# Patient Record
Sex: Female | Born: 1970 | Race: White | Hispanic: No | Marital: Married | Smoking: Former smoker
Health system: Southern US, Community
[De-identification: ages and names within clinical notes are randomized; demographics above are authoritative.]

## PROBLEM LIST (undated history)

## (undated) DIAGNOSIS — M069 Rheumatoid arthritis, unspecified: Secondary | ICD-10-CM

## (undated) DIAGNOSIS — R9389 Abnormal findings on diagnostic imaging of other specified body structures: Secondary | ICD-10-CM

## (undated) HISTORY — PX: LEEP: SHX91

## (undated) HISTORY — PX: BREAST BIOPSY: SHX20

---

## 2009-08-09 ENCOUNTER — Encounter: Admission: RE | Admit: 2009-08-09 | Discharge: 2009-08-09 | Payer: Self-pay | Admitting: Obstetrics and Gynecology

## 2015-05-21 ENCOUNTER — Other Ambulatory Visit: Payer: Self-pay | Admitting: Obstetrics and Gynecology

## 2015-05-21 DIAGNOSIS — R928 Other abnormal and inconclusive findings on diagnostic imaging of breast: Secondary | ICD-10-CM

## 2015-05-25 ENCOUNTER — Ambulatory Visit
Admission: RE | Admit: 2015-05-25 | Discharge: 2015-05-25 | Disposition: A | Discharge status: Home / Self Care | Payer: Commercial Indemnity | Source: Ambulatory Visit | Attending: Obstetrics and Gynecology | Admitting: Obstetrics and Gynecology

## 2015-05-25 DIAGNOSIS — R928 Other abnormal and inconclusive findings on diagnostic imaging of breast: Secondary | ICD-10-CM

## 2016-10-26 ENCOUNTER — Emergency Department (HOSPITAL_COMMUNITY)
Admission: EM | Admit: 2016-10-26 | Discharge: 2016-10-26 | Disposition: A | Discharge status: Home / Self Care | Payer: Managed Care, Other (non HMO) | Attending: Emergency Medicine | Admitting: Emergency Medicine

## 2016-10-26 ENCOUNTER — Encounter (HOSPITAL_COMMUNITY): Payer: Self-pay | Admitting: Emergency Medicine

## 2016-10-26 DIAGNOSIS — H9202 Otalgia, left ear: Secondary | ICD-10-CM | POA: Diagnosis present

## 2016-10-26 DIAGNOSIS — H6012 Cellulitis of left external ear: Secondary | ICD-10-CM | POA: Diagnosis not present

## 2016-10-26 DIAGNOSIS — H60391 Other infective otitis externa, right ear: Secondary | ICD-10-CM

## 2016-10-26 HISTORY — DX: Rheumatoid arthritis, unspecified: M06.9

## 2016-10-26 MED ORDER — DIPHENHYDRAMINE HCL 50 MG/ML IJ SOLN
25.0000 mg | Freq: Once | INTRAMUSCULAR | Status: AC
  Administered 2016-10-26: 25 mg via INTRAVENOUS
  Filled 2016-10-26: qty 1

## 2016-10-26 MED ORDER — VANCOMYCIN HCL IN DEXTROSE 1-5 GM/200ML-% IV SOLN
1000.0000 mg | Freq: Once | INTRAVENOUS | Status: AC
  Administered 2016-10-26: 1000 mg via INTRAVENOUS
  Filled 2016-10-26: qty 200

## 2016-10-26 MED ORDER — CIPROFLOXACIN HCL 500 MG PO TABS: 500.0000 mg | ORAL_TABLET | Freq: Two times a day (BID) | ORAL | 0 refills | Status: DC

## 2016-10-26 MED ORDER — SODIUM CHLORIDE 0.9 % IV BOLUS (SEPSIS)
1000.0000 mL | Freq: Once | INTRAVENOUS | Status: AC
  Administered 2016-10-26: 1000 mL via INTRAVENOUS

## 2016-10-26 MED ORDER — IBUPROFEN 400 MG PO TABS
400.0000 mg | ORAL_TABLET | Freq: Once | ORAL | Status: AC
  Administered 2016-10-26: 400 mg via ORAL
  Filled 2016-10-26: qty 1

## 2016-10-26 MED ORDER — ACETAMINOPHEN 325 MG PO TABS
ORAL_TABLET | ORAL | Status: AC
  Administered 2016-10-26: 650 mg via ORAL
  Filled 2016-10-26: qty 2

## 2016-10-26 MED ORDER — ACETAMINOPHEN 325 MG PO TABS
650.0000 mg | ORAL_TABLET | Freq: Once | ORAL | Status: AC
  Administered 2016-10-26: 650 mg via ORAL

## 2016-10-26 MED ORDER — ONDANSETRON HCL 4 MG/2ML IJ SOLN
4.0000 mg | Freq: Once | INTRAMUSCULAR | Status: AC
  Administered 2016-10-26: 4 mg via INTRAVENOUS
  Filled 2016-10-26: qty 2

## 2016-10-26 MED ORDER — CEPHALEXIN 500 MG PO CAPS
1000.0000 mg | ORAL_CAPSULE | Freq: Two times a day (BID) | ORAL | 0 refills | Status: DC
Start: 2016-10-26 — End: 2023-02-19

## 2016-10-26 MED ORDER — MORPHINE SULFATE (PF) 4 MG/ML IV SOLN
4.0000 mg | Freq: Once | INTRAVENOUS | Status: AC
  Administered 2016-10-26: 4 mg via INTRAVENOUS
  Filled 2016-10-26: qty 1

## 2016-10-26 MED ORDER — CIPROFLOXACIN IN D5W 400 MG/200ML IV SOLN
400.0000 mg | Freq: Once | INTRAVENOUS | Status: AC
  Administered 2016-10-26: 400 mg via INTRAVENOUS
  Filled 2016-10-26: qty 200

## 2016-10-26 NOTE — Discharge Instructions (Addendum)
It was our pleasure to provide your ER care today - we hope that you feel better.  Take antibiotics (keflex and cipro) as prescribed.  Take tylenol every 4 hours and motrin every 6 hours as need for pain and/or fever.   Follow up with primary care doctor, or here, for recheck tomorrow.   Return to ER if worse, new symptoms, spreading redness, intractable pain, weak/fainting, other concern.

## 2016-10-26 NOTE — ED Provider Notes (Signed)
[facility]-EMERGENCY DEPT Provider Note   CSN: [id number] Arrival date & time: 10/26/16  1116  By signing my name below, I, Linna Darner, attest that this documentation has been prepared under the direction and in the presence of physician practitioner, Cathren Laine, MD. Electronically Signed: Linna Darner, Scribe. 10/26/2016. 12:26 PM.  History   Chief Complaint Chief Complaint  Patient presents with  . Otalgia    The history is provided by the patient. No language interpreter was used.     HPI Comments: Autumn Russo is a 46 y.o. female who presents to the Emergency Department complaining of constant left ear pain beginning yesterday. She reports associated cervical lymphadenopathy, body aches, mild fever (tmax 101.5), a mild non-productive cough, nausea, and rhinorrhea. Pt was evaluated at a [facility] this morning and was diagnosed with a left ear infection; she was additionally swabbed for influenza and the test was negative. No recent trauma to her left ear. No h/o frequent ear infections. She notes her right ear feels normal. NKDA. Pt denies trouble swallowing, sore throat, vomiting, diarrhea, hearing loss, tinnitus, or any other associated symptoms.  Past Medical History:  Diagnosis Date  . RA (rheumatoid arthritis) (HCC)     There are no active problems to display for this patient.   History reviewed. No pertinent surgical history.  OB History    No data available       Home Medications    Prior to Admission medications   Not on File    Family History History reviewed. No pertinent family history.  Social History Social History  Substance Use Topics  . Smoking status: Never Smoker  . Smokeless tobacco: Never Used  . Alcohol use No     Allergies   Patient has no known allergies.   Review of Systems Review of Systems  Constitutional: Positive for fever.  HENT: Positive for ear pain (L) and rhinorrhea. Negative for hearing loss, sore throat, tinnitus  and trouble swallowing.   Respiratory: Positive for cough.   Gastrointestinal: Positive for nausea. Negative for diarrhea and vomiting.  Musculoskeletal: Positive for myalgias.  Hematological: Positive for adenopathy.  All other systems reviewed and are negative.    Physical Exam Updated Vital Signs BP 133/91 (BP Location: Right Arm)   Pulse (!) 122   Temp 100 F (37.8 C) (Oral)   Resp 18   SpO2 99%   Physical Exam  Constitutional: She is oriented to person, place, and time. She appears well-developed and well-nourished. No distress.  HENT:  Head: Normocephalic and atraumatic.  Left Ear: There is swelling. Tympanic membrane is erythematous.  Nose: Nose normal.  Mouth/Throat: Oropharynx is clear and moist.  Left otitis externa, w cellulitis left ear. Left preauricular and cervical l/a. No discrete mass felt.  Tm mildly erythematous. No focal mastoid tenderness.   Eyes: Conjunctivae and EOM are normal.  Neck: Neck supple. No tracheal deviation present.  No stiffness or rigidity.   Cardiovascular: Regular rhythm and normal heart sounds.   Pulmonary/Chest: Effort normal and breath sounds normal. No respiratory distress.  Musculoskeletal: Normal range of motion. She exhibits no edema.  Lymphadenopathy:       Head (left side): Preauricular adenopathy present.    She has cervical adenopathy (left anterior).  Neurological: She is alert and oriented to person, place, and time.  Skin: Skin is warm and dry. No rash noted.  Psychiatric: She has a normal mood and affect. Her behavior is normal.  Nursing note and vitals reviewed.  ED Treatments / Results  Labs (all labs ordered are listed, but only abnormal results are displayed) Labs Reviewed - No data to display  EKG  EKG Interpretation None       Radiology No results found.  Procedures Procedures (including critical care time)  DIAGNOSTIC STUDIES: Oxygen Saturation is 99% on RA, normal by my interpretation.     COORDINATION OF CARE: 12:33 PM Discussed treatment plan with pt at bedside and pt agreed to plan.  Medications Ordered in ED Medications  acetaminophen (TYLENOL) tablet 650 mg (650 mg Oral Given 10/26/16 1144)     Initial Impression / Assessment and Plan / ED Course  I have reviewed the triage vital signs and the nursing notes.  Pertinent labs & imaging results that were available during my care of the patient were reviewed by me and considered in my medical decision making (see chart for details).  Exam c/w otitis externa, with left preauricular and cervical l/a.    Confirmed nkda w pt.  cipro iv. vanc iv.   Iv ns bolus.   Morphine iv for pain (pt has ride).  Motrin po.  Recheck comfortable appearing.    rx for home.  Return precautions provided.  ENT f/u in next couple days if symptoms fail to improve.    Final Clinical Impressions(s) / ED Diagnoses   Final diagnoses:  None    New Prescriptions New Prescriptions   No medications on file  I personally performed the services described in this documentation, which was scribed in my presence. The recorded information has been reviewed and considered. Cathren LaineKevin Louisiana Searles, MD    Cathren LaineKevin Danya Spearman, MD 10/26/16 [id number]

## 2016-10-26 NOTE — ED Triage Notes (Signed)
Pt here for left ear pain and swelling with swelling into glands in neck; [facility]; fever noted

## 2020-10-23 ENCOUNTER — Other Ambulatory Visit: Payer: Self-pay | Admitting: Obstetrics and Gynecology

## 2020-10-23 DIAGNOSIS — R928 Other abnormal and inconclusive findings on diagnostic imaging of breast: Secondary | ICD-10-CM

## 2020-11-06 ENCOUNTER — Other Ambulatory Visit: Payer: Self-pay

## 2020-11-06 ENCOUNTER — Ambulatory Visit
Admission: RE | Admit: 2020-11-06 | Discharge: 2020-11-06 | Disposition: A | Discharge status: Home / Self Care | Payer: Commercial Indemnity | Source: Ambulatory Visit | Attending: Obstetrics and Gynecology | Admitting: Obstetrics and Gynecology

## 2020-11-06 ENCOUNTER — Other Ambulatory Visit: Payer: Self-pay | Admitting: Obstetrics and Gynecology

## 2020-11-06 DIAGNOSIS — R928 Other abnormal and inconclusive findings on diagnostic imaging of breast: Secondary | ICD-10-CM

## 2020-11-09 ENCOUNTER — Other Ambulatory Visit: Payer: Self-pay | Admitting: Obstetrics and Gynecology

## 2020-11-09 ENCOUNTER — Ambulatory Visit
Admission: RE | Admit: 2020-11-09 | Discharge: 2020-11-09 | Disposition: A | Discharge status: Home / Self Care | Payer: Managed Care, Other (non HMO) | Source: Ambulatory Visit | Attending: Obstetrics and Gynecology | Admitting: Obstetrics and Gynecology

## 2020-11-09 ENCOUNTER — Other Ambulatory Visit: Payer: Self-pay

## 2020-11-09 DIAGNOSIS — R928 Other abnormal and inconclusive findings on diagnostic imaging of breast: Secondary | ICD-10-CM

## 2020-11-14 ENCOUNTER — Other Ambulatory Visit: Payer: Managed Care, Other (non HMO)

## 2022-05-08 IMAGING — MG MM DIGITAL DIAGNOSTIC UNILAT*L* W/ TOMO W/ CAD
4 series · 4 of 12 positions shown · non-contrast
Comparison: Previous exams including recent screening mammogram
dated 10/17/2020

CLINICAL DATA: Patient returns today to evaluate a possible LEFT
breast asymmetry questioned on recent screening mammogram

EXAM:
DIGITAL DIAGNOSTIC UNILATERAL LEFT MAMMOGRAM WITH TOMOSYNTHESIS AND
CAD; ULTRASOUND LEFT BREAST LIMITED
TECHNIQUE: Left digital diagnostic mammography and breast tomosynthesis was
performed. The images were evaluated with computer-aided detection.;
Targeted ultrasound examination of the left breast was performed

[L ML synth-2D]
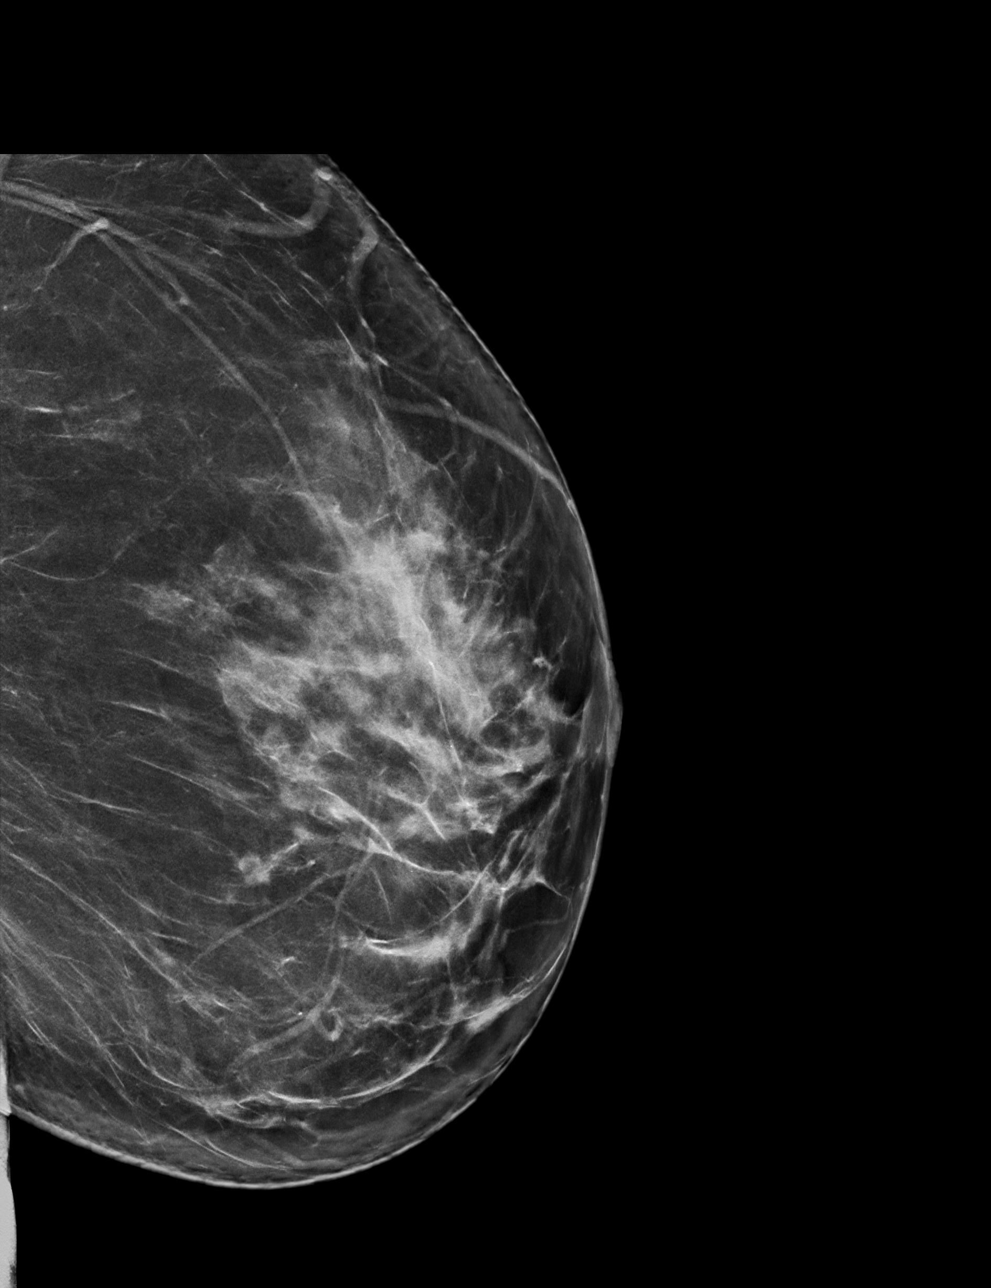

[L CC synth-2D]
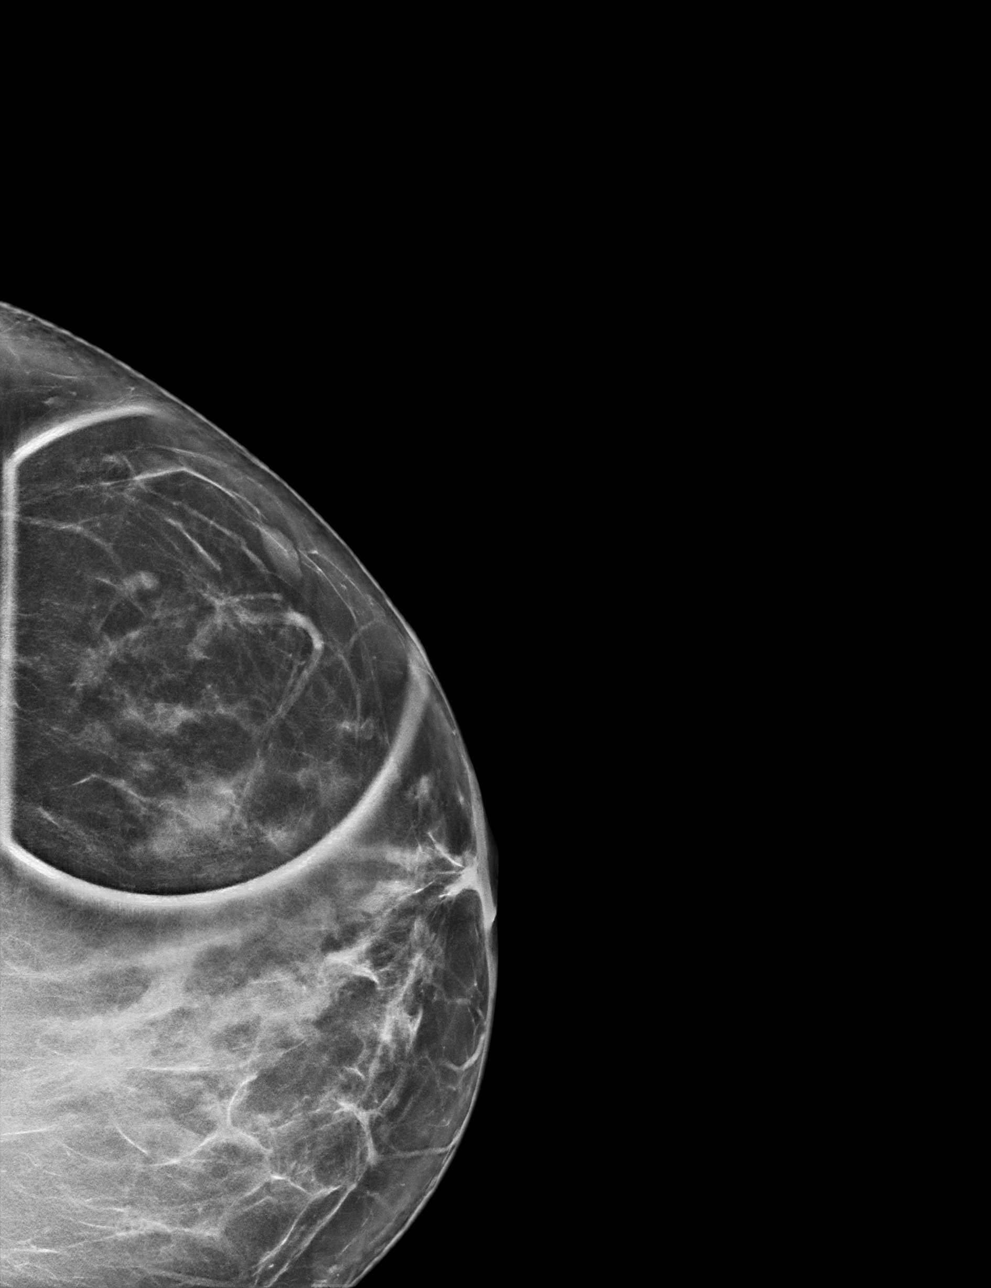

[L ML tomo · tomo slice 41/80.0]
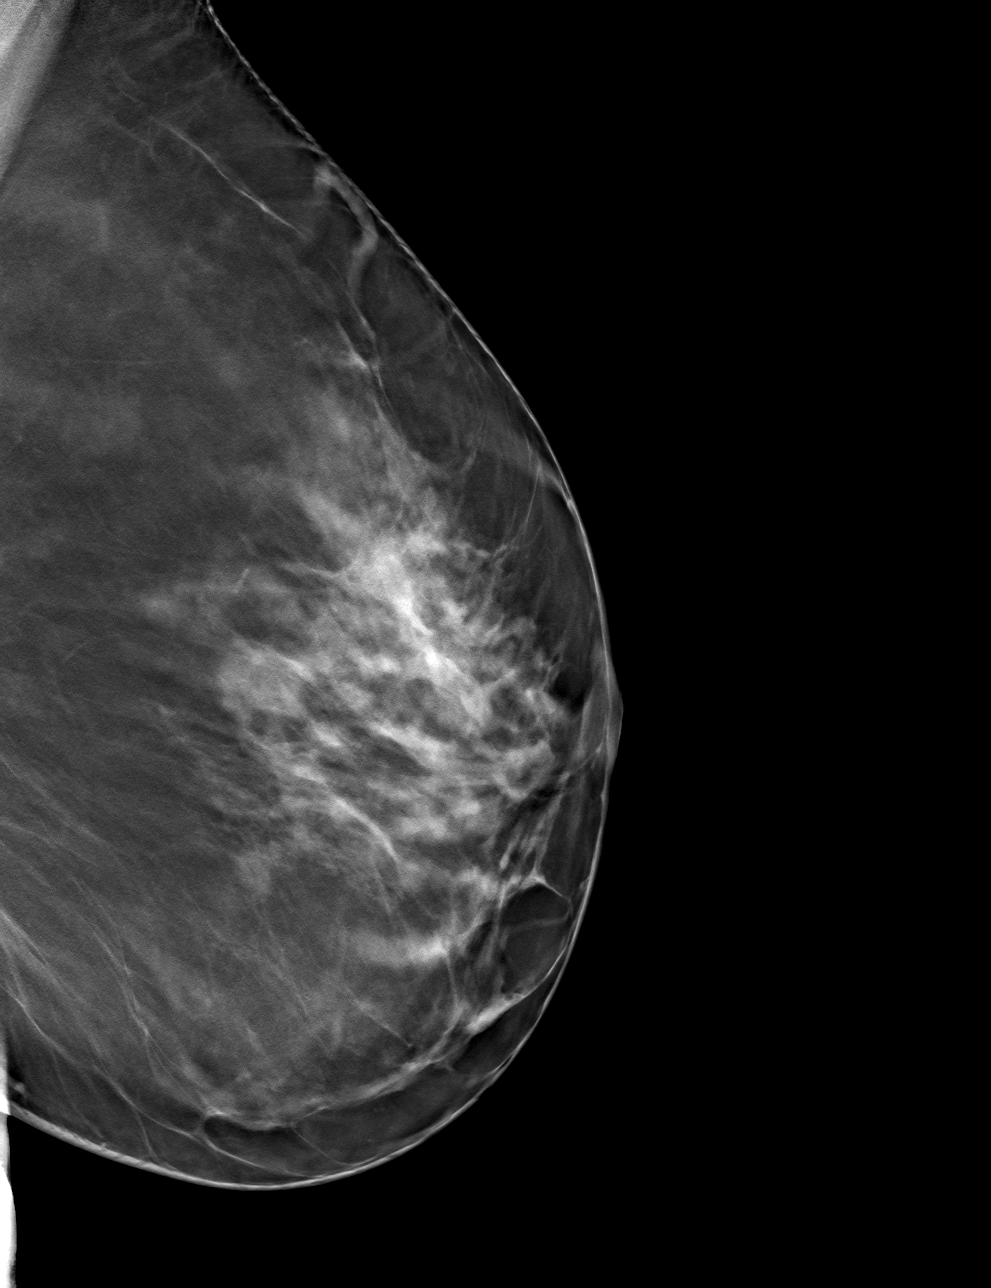

[L CC tomo · tomo slice 35/69.0]
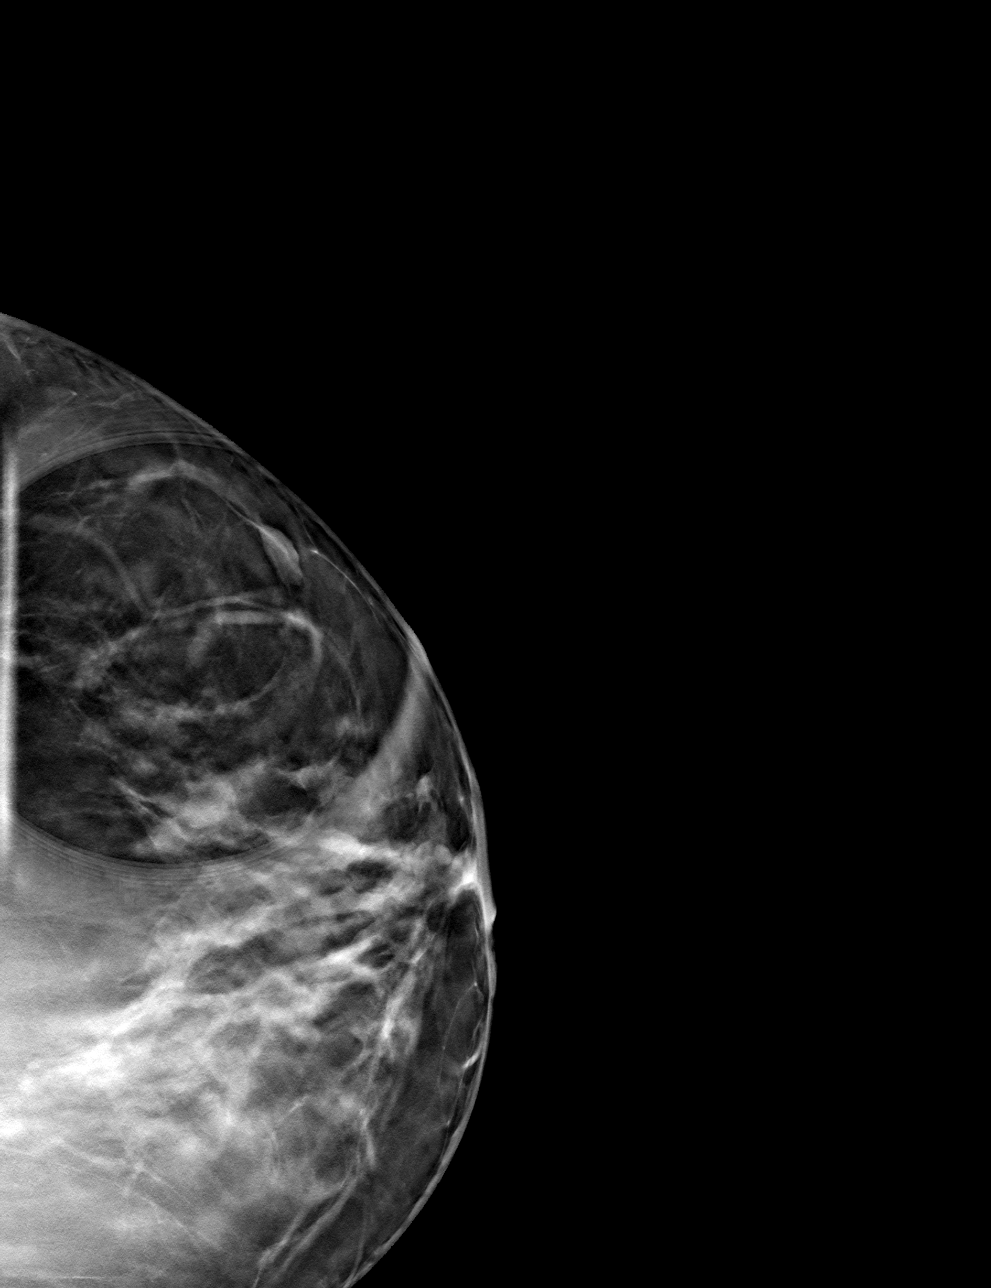

[4 of 12 positions shown; findings below may reference images not displayed]

ACR Breast Density Category c: The breast tissue is heterogeneously
dense, which may obscure small masses.
FINDINGS: LEFT breast diagnostic mammogram: On today's additional diagnostic
views, including spot compression with 3D tomosynthesis, an oval
circumscribed mass is confirmed within the outer LEFT breast, 3-4
o'clock axis based on tomosynthesis slice position, measuring
approximately 1 x 0.4 cm.

Targeted ultrasound is performed, showing an oval circumscribed
hypoechoic mass in the LEFT breast at the 3 o'clock axis, 6 cm from
the nipple, with internal vascularity, measuring 9 x 3 x 7 mm,
corresponding to the mammographic finding.

LEFT axilla was evaluated with ultrasound showing no enlarged or
morphologically abnormal lymph nodes.
IMPRESSION: Oval circumscribed hypoechoic mass in the LEFT breast at the 3
o'clock axis, 6 cm from the nipple, measuring 9 mm, with internal
vascularity, corresponding to the mammographic finding. This is a
suspicious finding for which ultrasound-guided biopsy is
recommended.

RECOMMENDATION:
Ultrasound-guided biopsy for the LEFT breast mass at the 3 o'clock
axis, 6 cm from the nipple, measuring 9 mm.

Ultrasound-guided biopsy is scheduled for [REDACTED].

I have discussed the findings and recommendations with the patient.
If applicable, a reminder letter will be sent to the patient
regarding the next appointment.

BI-RADS CATEGORY  4: Suspicious.

## 2022-05-08 IMAGING — US US BREAST*L* LIMITED INC AXILLA
1 series · 13 of 14 positions shown · non-contrast
Comparison: Previous exams including recent screening mammogram
dated 10/17/2020

CLINICAL DATA: Patient returns today to evaluate a possible LEFT
breast asymmetry questioned on recent screening mammogram

EXAM:
DIGITAL DIAGNOSTIC UNILATERAL LEFT MAMMOGRAM WITH TOMOSYNTHESIS AND
CAD; ULTRASOUND LEFT BREAST LIMITED
TECHNIQUE: Left digital diagnostic mammography and breast tomosynthesis was
performed. The images were evaluated with computer-aided detection.;
Targeted ultrasound examination of the left breast was performed

[Series 1: us breast*left* limited inc axilla · 0.06mm/px · 13 of 14 slices shown]
[im 1/14]
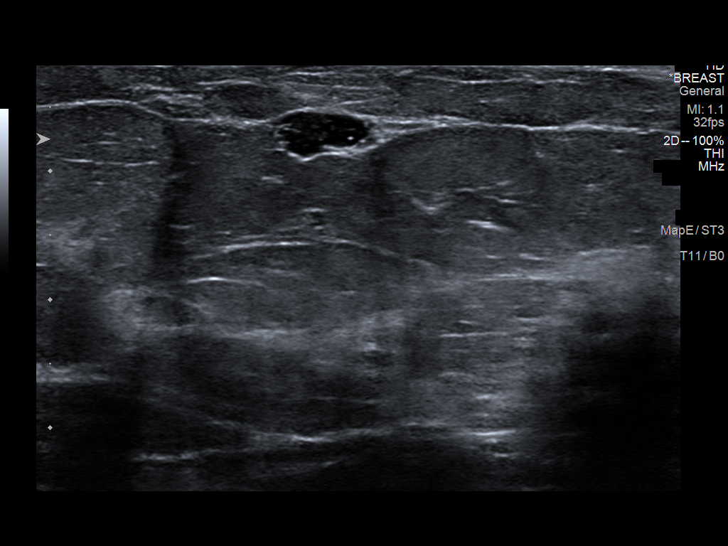
[im 2/14]
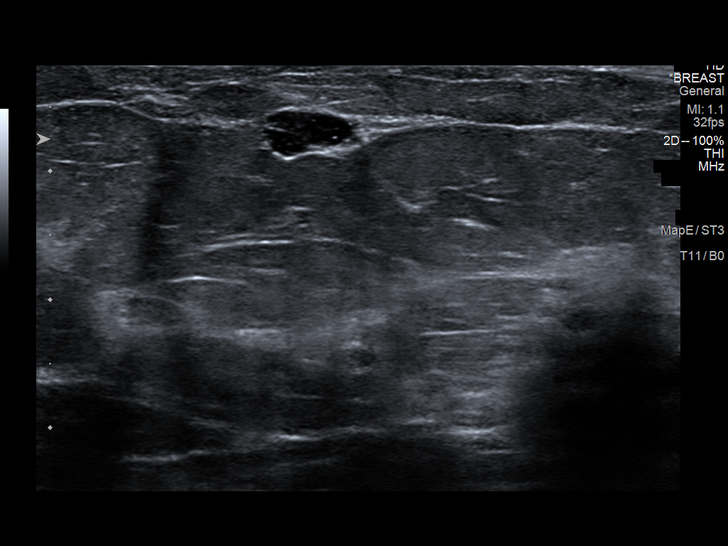
[im 3/14]
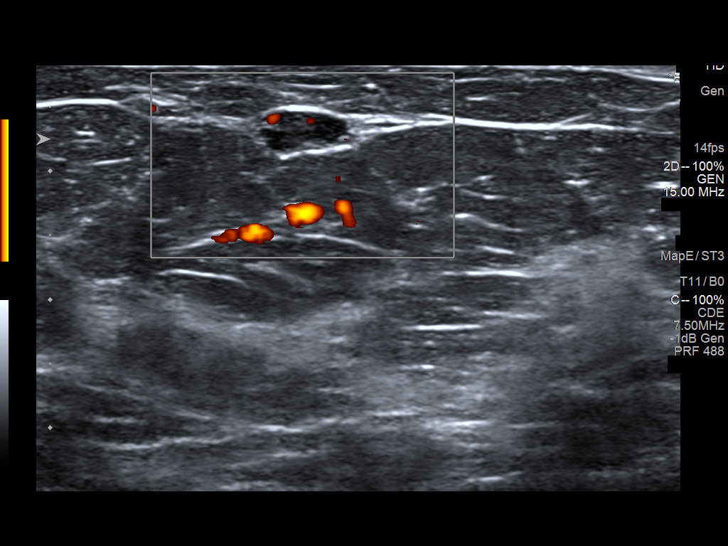
[im 4/14]
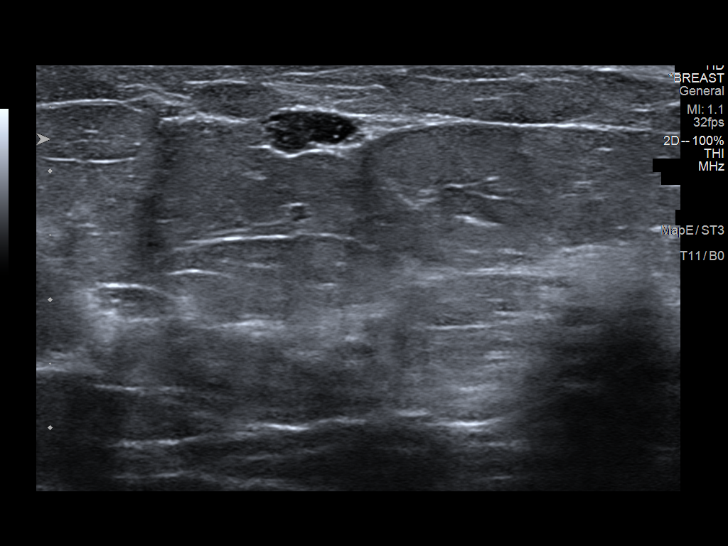
[im 5/14]
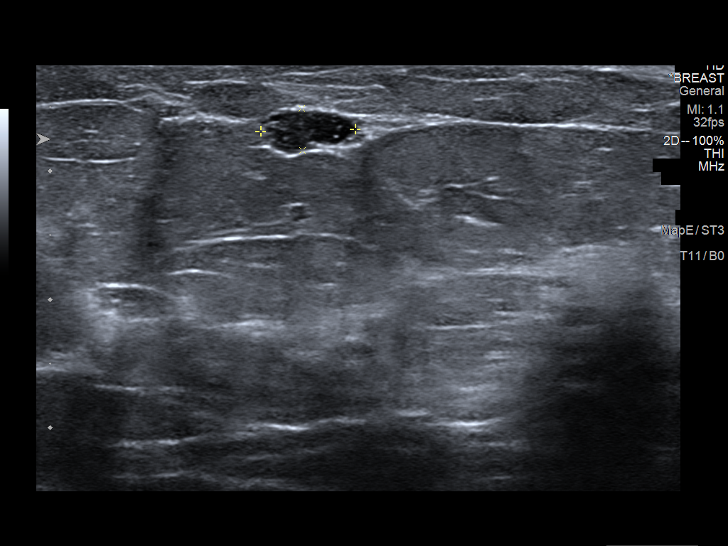
[im 6/14]
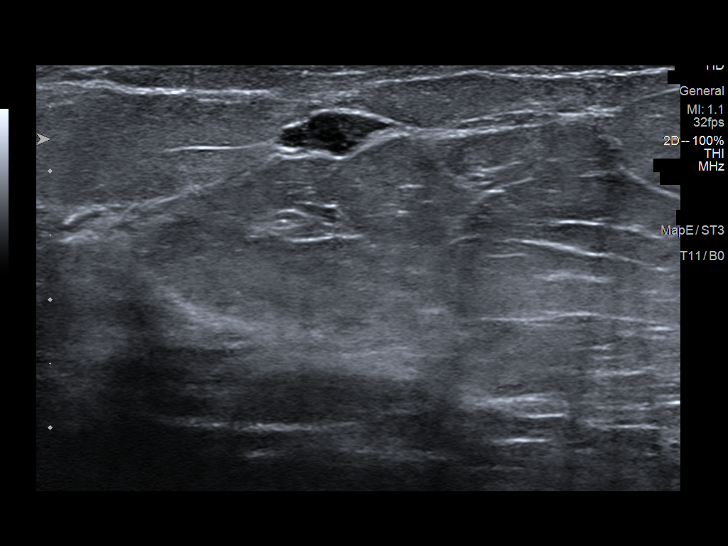
[im 8/14]
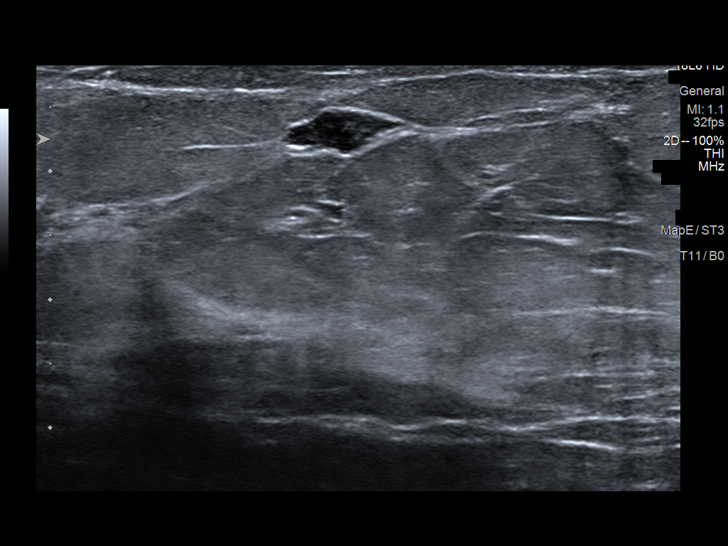
[im 9/14]
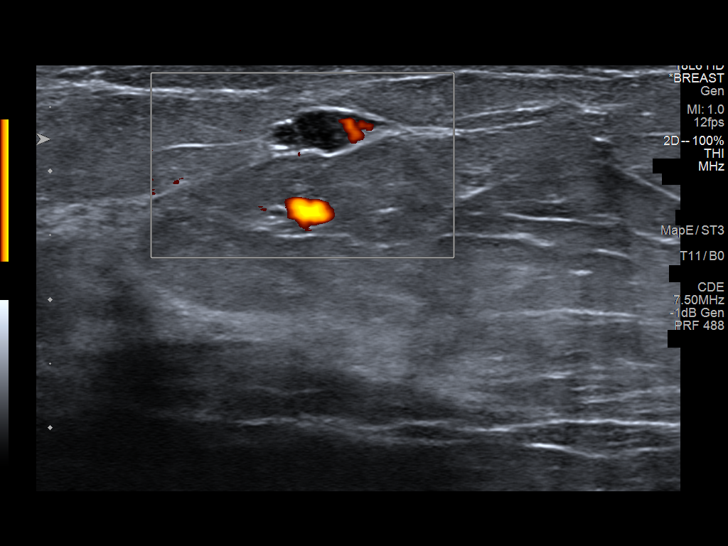
[im 10/14]
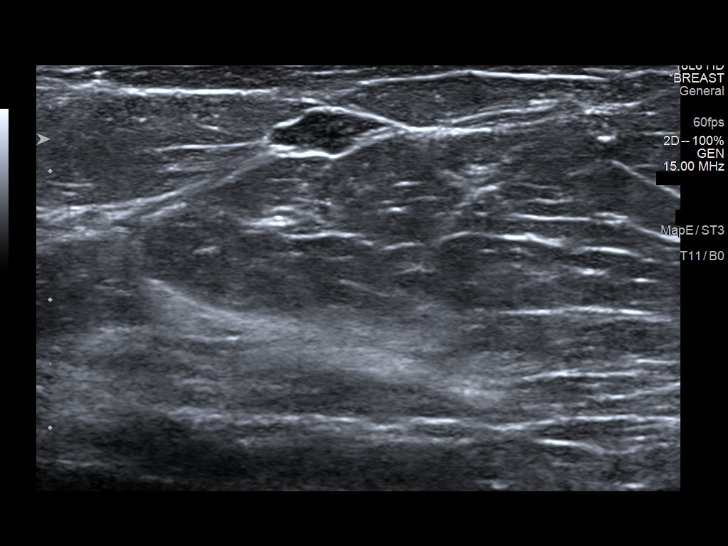
[im 11/14]
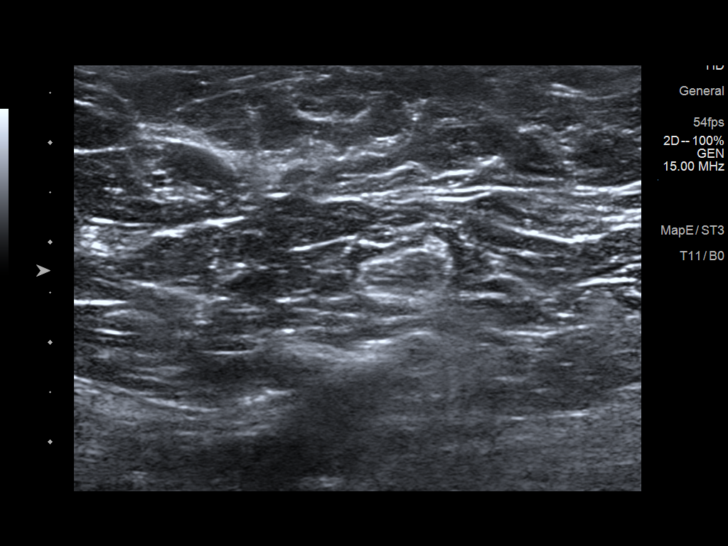
[im 12/14]
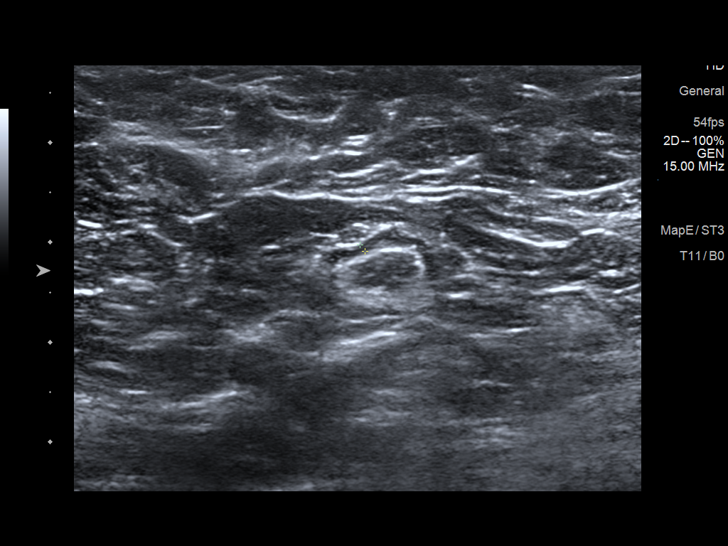
[im 13/14]
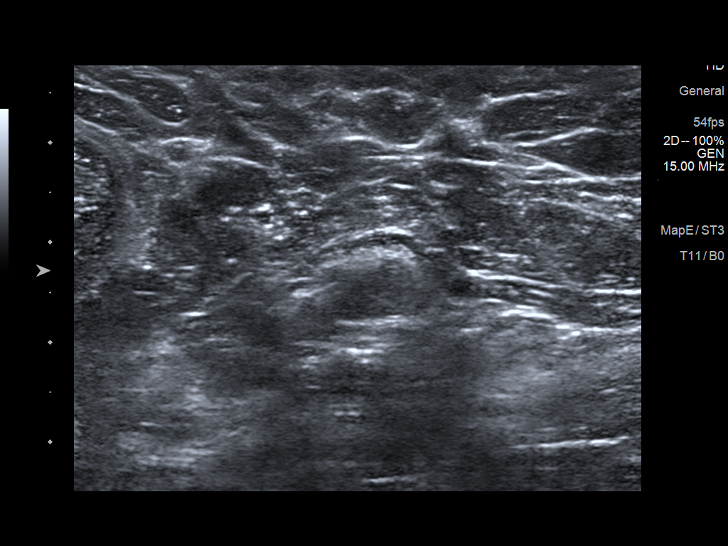
[im 14/14]
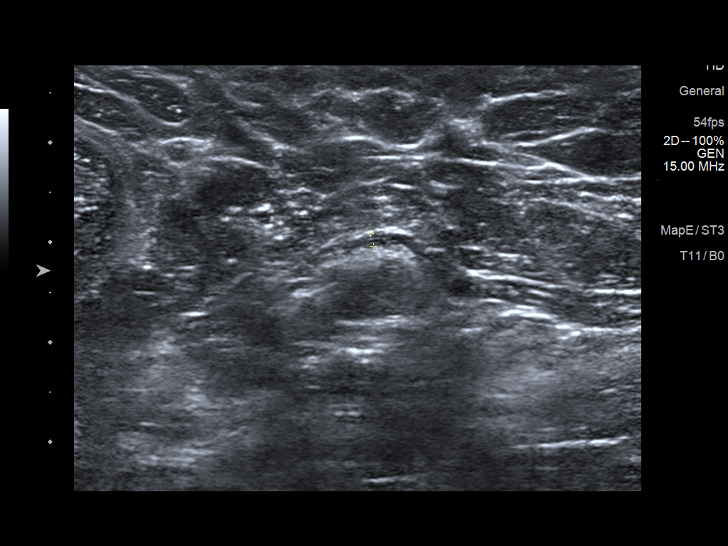

[13 of 14 positions shown; findings below may reference images not displayed]

ACR Breast Density Category c: The breast tissue is heterogeneously
dense, which may obscure small masses.
FINDINGS: LEFT breast diagnostic mammogram: On today's additional diagnostic
views, including spot compression with 3D tomosynthesis, an oval
circumscribed mass is confirmed within the outer LEFT breast, 3-4
o'clock axis based on tomosynthesis slice position, measuring
approximately 1 x 0.4 cm.

Targeted ultrasound is performed, showing an oval circumscribed
hypoechoic mass in the LEFT breast at the 3 o'clock axis, 6 cm from
the nipple, with internal vascularity, measuring 9 x 3 x 7 mm,
corresponding to the mammographic finding.

LEFT axilla was evaluated with ultrasound showing no enlarged or
morphologically abnormal lymph nodes.
IMPRESSION: Oval circumscribed hypoechoic mass in the LEFT breast at the 3
o'clock axis, 6 cm from the nipple, measuring 9 mm, with internal
vascularity, corresponding to the mammographic finding. This is a
suspicious finding for which ultrasound-guided biopsy is
recommended.

RECOMMENDATION:
Ultrasound-guided biopsy for the LEFT breast mass at the 3 o'clock
axis, 6 cm from the nipple, measuring 9 mm.

Ultrasound-guided biopsy is scheduled for [REDACTED].

I have discussed the findings and recommendations with the patient.
If applicable, a reminder letter will be sent to the patient
regarding the next appointment.

BI-RADS CATEGORY  4: Suspicious.

## 2023-02-12 ENCOUNTER — Encounter: Payer: Self-pay | Admitting: Gynecologic Oncology

## 2023-02-12 NOTE — Progress Notes (Signed)
GYNECOLOGIC ONCOLOGY NEW PATIENT CONSULTATION   Patient Name: Autumn Russo  Patient Age: 52 y.o. Date of Service: 02/13/23 Referring Provider: Hulda Humphrey, DO  Primary Care Provider: [facility] Provider: Eugene Garnet, MD   Assessment/Plan:  Perimenopausal patient with abnormal uterine bleeding, thickened endometrium.  We reviewed recent prolonged episode of bleeding as well as pelvic ultrasound from her OB/GYN yesterday that showed thickening of the endometrium as well as a mildly complex adnexal mass.  She had an endometrial biopsy performed yesterday.  I had my office reach out to see if this could be rushed to have some sort of idea of whether this would be diagnostic today, but unfortunately this was not possible.  Because the patient's bleeding stopped overnight, I suggested that we attempt another biopsy today to see if it is possible to sample more tissue rather than just blood.  This was performed without difficulty today.  This will be sent as a rushed specimen.  We discussed several possibilities.  The first is that the biopsy is diagnostic.  If biopsy shows benign endometrium, I suggest that we plan for hysteroscopy with D&C to further sample the endometrium and rule out any endometrial pathology.  Hopefully, this would also help treat the patient's bleeding.  If the endometrial biopsy shows endometrial intraepithelial neoplasia or malignancy, we discussed that the standard of care would be definitive surgery with total hysterectomy, BSO.  If the biopsy is not diagnostic, then I would recommend that we proceed with intraoperative sampling with a dilation and curettage that would be sent for frozen.  If this were to show EIN or malignancy, then we would proceed at that time with definitive surgical management.  We reviewed the nature of endometrial cancer if diagnosed on biopsy or at the time of frozen section and its recommended surgical  staging, including total hysterectomy, bilateral salpingo-oophorectomy, and lymph node assessment. The patient is a suitable candidate for staging via a minimally invasive approach to surgery.  We reviewed that robotic assistance would be used to complete the surgery.   If EIN or endometrial cancer is diagnosed on biopsy or on frozen section, I would recommend lymph node sampling using sentinel lymph node biopsy.  We reviewed the sentinel lymph node technique. Risks and benefits of sentinel lymph node biopsy was reviewed. We reviewed the technique and ICG dye. The patient DOES NOT have an iodine allergy or known liver dysfunction. We reviewed the false negative rate (0.4%), and that 3% of patients with metastatic disease will not have it detected by SLN biopsy in endometrial cancer. A low risk of allergic reaction to the dye, <0.2% for ICG, has been reported. We also discussed that in the case of failed mapping, which occurs 40% of the time, a bilateral or unilateral lymphadenectomy will be performed at the surgeon's discretion.   Potential benefits of sentinel nodes including a higher detection rate for metastasis due to ultrastaging and potential reduction in operative morbidity. However, there remains uncertainty as to the role for treatment of micrometastatic disease. Further, the benefit of operative morbidity associated with the SLN technique in endometrial cancer is not yet completely known. In other patient populations (e.g. the cervical cancer population) there has been observed reductions in morbidity with SLN biopsy compared to pelvic lymphadenectomy. Lymphedema, nerve dysfunction and lymphocysts are all potential risks with the SLN technique as with complete lymphadenectomy. Additional risks to the patient include the risk of damage to an internal organ while operating in an altered  view (e.g. the black and white image of the robotic fluorescence imaging mode).   We discussed the plan for possible  hysteroscopy with D&C, possible robotic assisted hysterectomy, bilateral salpingo-oophorectomy, possible sentinel lymph node evaluation, possible lymph node dissection, possible laparotomy. The risks of surgery were discussed in detail and she understands these to include infection; wound separation; hernia; vaginal cuff separation, injury to adjacent organs such as bowel, bladder, blood vessels, ureters and nerves; bleeding which may require blood transfusion; anesthesia risk; thromboembolic events; possible death; unforeseen complications; possible need for re-exploration; medical complications such as heart attack, stroke, pleural effusion and pneumonia; and, if full lymphadenectomy is performed the risk of lymphedema and lymphocyst. The patient will receive DVT and antibiotic prophylaxis as indicated. She voiced a clear understanding. She had the opportunity to ask questions. Perioperative instructions were reviewed with her. Prescriptions for post-op medications were sent to her pharmacy of choice.  I encouraged the patient to continue on Megace.  I think she can taper more quickly given complete sensation of her bleeding as of this morning.  I discussed that I would like her to continue taking at least 1 pill a day until surgery to help prevent heavy bleeding.  A copy of this note was sent to the patient's referring provider.   70 minutes of total time was spent for this patient encounter, including preparation, face-to-face counseling with the patient and coordination of care, and documentation of the encounter.  Eugene Garnet, MD  Division of Gynecologic Oncology  Department of Obstetrics and Gynecology  [facility]  ___________________________________________  Chief Complaint: No chief complaint on file.   History of Present Illness:  Autumn Russo is a 52 y.o. y.o. female who is seen in consultation at the request of Dr. Claiborne Billings for an evaluation of  perimenopausal bleeding, thickened endometrium.  Patient was on oral contraceptive pills until August 2023.  She had been on these for approximately 24 years for birth control.  She would typically have a light monthly menses.  After stopping OCPs, she had 1 week of heavy bleeding in October.  She had a normal menses in December.  Most recently, in mid May, she had a week of heavy bleeding followed by a week of spotting followed by a week of normal bleeding followed by a week of very heavy bleeding.  This last week required changing super tampons and many pads multiple times during the day.  Yesterday, she had changed both 9 times before 3 PM and had bled through the tampons and pads twice.  She endorses passing small clots as well.  She denies any associated pain or cramping.  Yesterday, the patient was seen by her OB/GYN in the setting of her bleeding.  Pelvic ultrasound exam showed a uterus measuring 9.4 x 6.01 x 4.3 cm with an endometrial lining of 28.3 mm.  Endometrium is described as heterogenous and thickened with areas of cystic spaces, hypervascular.  There is a 6 x 5.1 x 5.1 cm right adnexal versus right ovarian septated lesion.  No free fluid seen.  Left ovary not visualized.  Patient was given a Megace taper yesterday.  She is now taken 3 doses and had minimal bleeding overnight and nothing on her tampon when she removed it earlier this morning.  She endorses a good appetite without nausea or emesis.  She notes normal bowel function.  Has had some increased urinary frequency and is voiding less when she goes for the last week.  She denies  any dysuria.  Family history is notable for uterine cancer diagnosed in her mother approximately 25 years ago.  Mother is still alive.  PAST MEDICAL HISTORY:  Past Medical History:  Diagnosis Date   RA (rheumatoid arthritis) (HCC)    treated for previously, but now symptoms currently     PAST SURGICAL HISTORY:  Past Surgical History:  Procedure  Laterality Date   BREAST BIOPSY Left    LEEP      OB/GYN HISTORY:  OB History  Gravida Para Term Preterm AB Living  2 2          SAB IAB Ectopic Multiple Live Births               # Outcome Date GA Lbr Len/2nd Weight Sex Delivery Anes PTL Lv  2 Para           1 Para             No LMP recorded.  Age at menarche: 6  Age at menopause: n/a Hx of HRT: n/a Hx of STDs: HPV Last pap: 12/2022 - unknown results. Pap 2020- [facility]. History of abnormal pap smears: Yes, history of cervical dysplasia requiring a LEEP in approximately 1994.  OB/GYN notes that she was positive for HPV in 2013 (normal on retest in 2014).  SCREENING STUDIES:  Last mammogram: 2024   MEDICATIONS: Outpatient Encounter Medications as of 02/13/2023  Medication Sig   cephALEXin (KEFLEX) 500 MG capsule Take 2 capsules (1,000 mg total) by mouth 2 (two) times daily.   ciprofloxacin (CIPRO) 500 MG tablet Take 1 tablet (500 mg total) by mouth 2 (two) times daily.   No facility-administered encounter medications on file as of 02/13/2023.    ALLERGIES:  No Known Allergies   FAMILY HISTORY:  Family History  Problem Relation Age of Onset   Uterine cancer Mother        dx in early 62s, still living     SOCIAL HISTORY:  Social Connections: Not on file    REVIEW OF SYSTEMS:  Denies appetite changes, fevers, chills, fatigue, unexplained weight changes. Denies hearing loss, neck lumps or masses, mouth sores, ringing in ears or voice changes. Denies cough or wheezing.  Denies shortness of breath. Denies chest pain or palpitations. Denies leg swelling. Denies abdominal distention, pain, blood in stools, constipation, diarrhea, nausea, vomiting, or early satiety. Denies pain with intercourse, dysuria, frequency, hematuria or incontinence. Denies hot flashes, pelvic pain, or vaginal discharge.   Denies joint pain, back pain or muscle pain/cramps. Denies itching, rash, or wounds. Denies dizziness, headaches, numbness  or seizures. Denies swollen lymph nodes or glands, denies easy bruising or bleeding. Denies anxiety, depression, confusion, or decreased concentration.  Physical Exam:  Vital Signs for this encounter:  Blood pressure 126/73, pulse 87, resp. rate 16, height 5' 4.57" (1.64 m), weight 165 lb 9.6 oz (75.1 kg), SpO2 100 %. Body mass index is 27.93 kg/m. General: Alert, oriented, no acute distress.  HEENT: Normocephalic, atraumatic. Sclera anicteric.  Chest: Clear to auscultation bilaterally. No wheezes, rhonchi, or rales. Cardiovascular: Regular rate and rhythm, no murmurs, rubs, or gallops.  Abdomen: Normoactive bowel sounds. Soft, nondistended, nontender to palpation. No masses or hepatosplenomegaly appreciated. No palpable fluid wave.  Extremities: Grossly normal range of motion. Warm, well perfused. No edema bilaterally.  Skin: No rashes or lesions.  Lymphatics: No cervical, supraclavicular, or inguinal adenopathy.  GU:  Normal external female genitalia. No lesions. No discharge or bleeding.  Bladder/urethra:  No lesions or masses, well supported bladder             Vagina: Well-rugated, no lesions.             Cervix: Normal appearing, nabothian cyst noted near the external os.             Uterus: 8-10, mobile, no parametrial involvement or nodularity.             Adnexa: Fullness within the posterior cul de sac, mobile, no nodularity.  Rectal: Deferred.  Endometrial biopsy procedure Preoperative diagnosis: Thickened lining, postmenopausal bleeding Postoperative diagnosis: Same as above Physician: Pricilla Holm MD Estimated blood loss: Minimal Specimens: Endometrial biopsy Procedure: After the procedure was discussed with the patient including risks and benefits, she gave verbal consent.  She was then placed in dorsolithotomy position and a speculum was placed in the vagina.  Once the cervix was well visualized it was cleansed with Betadine x3.  An endometrial Pipelle was then  passed to a depth of 8.5 cm.  5 passes were performed with mostly blood noted, hard to assess for tisse.  This was placed in formalin.  Overall the patient tolerated the procedure well.  All instruments were removed from the vagina.  LABORATORY AND RADIOLOGIC DATA:  Outside medical records were reviewed to synthesize the above history, along with the history and physical obtained during the visit.   No results found for: "WBC", "HGB", "HCT", "PLT", "GLUCOSE", "CHOL", "TRIG", "HDL", "LDLDIRECT", "LDLCALC", "ALT", "AST", "NA", "K", "CL", "CREATININE", "BUN", "CO2", "TSH", "PSA", "INR", "GLUF", "HGBA1C", "MICROALBUR"

## 2023-02-12 NOTE — H&P (View-Only) (Signed)
GYNECOLOGIC ONCOLOGY NEW PATIENT CONSULTATION   Patient Name: Autumn Russo  Patient Age: 52 y.o. Date of Service: 02/13/23 Referring Provider: Didney Callahan, DO  Primary Care Provider: [place], [facility] Provider: Thiago Ragsdale, MD   Assessment/Plan:  Perimenopausal patient with abnormal uterine bleeding, thickened endometrium.  We reviewed recent prolonged episode of bleeding as well as pelvic ultrasound from her OB/GYN yesterday that showed thickening of the endometrium as well as a mildly complex adnexal mass.  She had an endometrial biopsy performed yesterday.  I had my office reach out to see if this could be rushed to have some sort of idea of whether this would be diagnostic today, but unfortunately this was not possible.  Because the patient's bleeding stopped overnight, I suggested that we attempt another biopsy today to see if it is possible to sample more tissue rather than just blood.  This was performed without difficulty today.  This will be sent as a rushed specimen.  We discussed several possibilities.  The first is that the biopsy is diagnostic.  If biopsy shows benign endometrium, I suggest that we plan for hysteroscopy with D&C to further sample the endometrium and rule out any endometrial pathology.  Hopefully, this would also help treat the patient's bleeding.  If the endometrial biopsy shows endometrial intraepithelial neoplasia or malignancy, we discussed that the standard of care would be definitive surgery with total hysterectomy, BSO.  If the biopsy is not diagnostic, then I would recommend that we proceed with intraoperative sampling with a dilation and curettage that would be sent for frozen.  If this were to show EIN or malignancy, then we would proceed at that time with definitive surgical management.  We reviewed the nature of endometrial cancer if diagnosed on biopsy or at the time of frozen section and its recommended surgical  staging, including total hysterectomy, bilateral salpingo-oophorectomy, and lymph node assessment. The patient is a suitable candidate for staging via a minimally invasive approach to surgery.  We reviewed that robotic assistance would be used to complete the surgery.   If EIN or endometrial cancer is diagnosed on biopsy or on frozen section, I would recommend lymph node sampling using sentinel lymph node biopsy.  We reviewed the sentinel lymph node technique. Risks and benefits of sentinel lymph node biopsy was reviewed. We reviewed the technique and ICG dye. The patient DOES NOT have an iodine allergy or known liver dysfunction. We reviewed the false negative rate (0.4%), and that 3% of patients with metastatic disease will not have it detected by SLN biopsy in endometrial cancer. A low risk of allergic reaction to the dye, <0.2% for ICG, has been reported. We also discussed that in the case of failed mapping, which occurs 40% of the time, a bilateral or unilateral lymphadenectomy will be performed at the surgeon's discretion.   Potential benefits of sentinel nodes including a higher detection rate for metastasis due to ultrastaging and potential reduction in operative morbidity. However, there remains uncertainty as to the role for treatment of micrometastatic disease. Further, the benefit of operative morbidity associated with the SLN technique in endometrial cancer is not yet completely known. In other patient populations (e.g. the cervical cancer population) there has been observed reductions in morbidity with SLN biopsy compared to pelvic lymphadenectomy. Lymphedema, nerve dysfunction and lymphocysts are all potential risks with the SLN technique as with complete lymphadenectomy. Additional risks to the patient include the risk of damage to an internal organ while operating in an altered   view (e.g. the black and white image of the robotic fluorescence imaging mode).   We discussed the plan for possible  hysteroscopy with D&C, possible robotic assisted hysterectomy, bilateral salpingo-oophorectomy, possible sentinel lymph node evaluation, possible lymph node dissection, possible laparotomy. The risks of surgery were discussed in detail and she understands these to include infection; wound separation; hernia; vaginal cuff separation, injury to adjacent organs such as bowel, bladder, blood vessels, ureters and nerves; bleeding which may require blood transfusion; anesthesia risk; thromboembolic events; possible death; unforeseen complications; possible need for re-exploration; medical complications such as heart attack, stroke, pleural effusion and pneumonia; and, if full lymphadenectomy is performed the risk of lymphedema and lymphocyst. The patient will receive DVT and antibiotic prophylaxis as indicated. She voiced a clear understanding. She had the opportunity to ask questions. Perioperative instructions were reviewed with her. Prescriptions for post-op medications were sent to her pharmacy of choice.  I encouraged the patient to continue on Megace.  I think she can taper more quickly given complete sensation of her bleeding as of this morning.  I discussed that I would like her to continue taking at least 1 pill a day until surgery to help prevent heavy bleeding.  A copy of this note was sent to the patient's referring provider.   70 minutes of total time was spent for this patient encounter, including preparation, face-to-face counseling with the patient and coordination of care, and documentation of the encounter.  Quetzali Heinle, MD  Division of Gynecologic Oncology  Department of Obstetrics and Gynecology  [facility]  ___________________________________________  Chief Complaint: No chief complaint on file.   History of Present Illness:  Autumn Russo is a 52 y.o. y.o. female who is seen in consultation at the request of Dr. Callahan for an evaluation of  perimenopausal bleeding, thickened endometrium.  Patient was on oral contraceptive pills until August 2023.  She had been on these for approximately 24 years for birth control.  She would typically have a light monthly menses.  After stopping OCPs, she had 1 week of heavy bleeding in October.  She had a normal menses in December.  Most recently, in mid May, she had a week of heavy bleeding followed by a week of spotting followed by a week of normal bleeding followed by a week of very heavy bleeding.  This last week required changing super tampons and many pads multiple times during the day.  Yesterday, she had changed both 9 times before 3 PM and had bled through the tampons and pads twice.  She endorses passing small clots as well.  She denies any associated pain or cramping.  Yesterday, the patient was seen by her OB/GYN in the setting of her bleeding.  Pelvic ultrasound exam showed a uterus measuring 9.4 x 6.01 x 4.3 cm with an endometrial lining of 28.3 mm.  Endometrium is described as heterogenous and thickened with areas of cystic spaces, hypervascular.  There is a 6 x 5.1 x 5.1 cm right adnexal versus right ovarian septated lesion.  No free fluid seen.  Left ovary not visualized.  Patient was given a Megace taper yesterday.  She is now taken 3 doses and had minimal bleeding overnight and nothing on her tampon when she removed it earlier this morning.  She endorses a good appetite without nausea or emesis.  She notes normal bowel function.  Has had some increased urinary frequency and is voiding less when she goes for the last week.  She denies   any dysuria.  Family history is notable for uterine cancer diagnosed in her mother approximately 25 years ago.  Mother is still alive.  PAST MEDICAL HISTORY:  Past Medical History:  Diagnosis Date   RA (rheumatoid arthritis) (HCC)    treated for previously, but now symptoms currently     PAST SURGICAL HISTORY:  Past Surgical History:  Procedure  Laterality Date   BREAST BIOPSY Left    LEEP      OB/GYN HISTORY:  OB History  Gravida Para Term Preterm AB Living  2 2          SAB IAB Ectopic Multiple Live Births               # Outcome Date GA Lbr Len/2nd Weight Sex Delivery Anes PTL Lv  2 Para           1 Para             No LMP recorded.  Age at menarche: 11  Age at menopause: n/a Hx of HRT: n/a Hx of STDs: HPV Last pap: 12/2022 - unknown results. Pap 2020- [facility]. History of abnormal pap smears: Yes, history of cervical dysplasia requiring a LEEP in approximately 1994.  OB/GYN notes that she was positive for HPV in 2013 (normal on retest in 2014).  SCREENING STUDIES:  Last mammogram: 2024   MEDICATIONS: Outpatient Encounter Medications as of 02/13/2023  Medication Sig   cephALEXin (KEFLEX) 500 MG capsule Take 2 capsules (1,000 mg total) by mouth 2 (two) times daily.   ciprofloxacin (CIPRO) 500 MG tablet Take 1 tablet (500 mg total) by mouth 2 (two) times daily.   No facility-administered encounter medications on file as of 02/13/2023.    ALLERGIES:  No Known Allergies   FAMILY HISTORY:  Family History  Problem Relation Age of Onset   Uterine cancer Mother        dx in early 50s, still living     SOCIAL HISTORY:  Social Connections: Not on file    REVIEW OF SYSTEMS:  Denies appetite changes, fevers, chills, fatigue, unexplained weight changes. Denies hearing loss, neck lumps or masses, mouth sores, ringing in ears or voice changes. Denies cough or wheezing.  Denies shortness of breath. Denies chest pain or palpitations. Denies leg swelling. Denies abdominal distention, pain, blood in stools, constipation, diarrhea, nausea, vomiting, or early satiety. Denies pain with intercourse, dysuria, frequency, hematuria or incontinence. Denies hot flashes, pelvic pain, or vaginal discharge.   Denies joint pain, back pain or muscle pain/cramps. Denies itching, rash, or wounds. Denies dizziness, headaches, numbness  or seizures. Denies swollen lymph nodes or glands, denies easy bruising or bleeding. Denies anxiety, depression, confusion, or decreased concentration.  Physical Exam:  Vital Signs for this encounter:  Blood pressure 126/73, pulse 87, resp. rate 16, height 5' 4.57" (1.64 m), weight 165 lb 9.6 oz (75.1 kg), SpO2 100 %. Body mass index is 27.93 kg/m. General: Alert, oriented, no acute distress.  HEENT: Normocephalic, atraumatic. Sclera anicteric.  Chest: Clear to auscultation bilaterally. No wheezes, rhonchi, or rales. Cardiovascular: Regular rate and rhythm, no murmurs, rubs, or gallops.  Abdomen: Normoactive bowel sounds. Soft, nondistended, nontender to palpation. No masses or hepatosplenomegaly appreciated. No palpable fluid wave.  Extremities: Grossly normal range of motion. Warm, well perfused. No edema bilaterally.  Skin: No rashes or lesions.  Lymphatics: No cervical, supraclavicular, or inguinal adenopathy.  GU:  Normal external female genitalia. No lesions. No discharge or bleeding.               Bladder/urethra:  No lesions or masses, well supported bladder             Vagina: Well-rugated, no lesions.             Cervix: Normal appearing, nabothian cyst noted near the external os.             Uterus: 8-10, mobile, no parametrial involvement or nodularity.             Adnexa: Fullness within the posterior cul de sac, mobile, no nodularity.  Rectal: Deferred.  Endometrial biopsy procedure Preoperative diagnosis: Thickened lining, postmenopausal bleeding Postoperative diagnosis: Same as above Physician: Ellison Leisure MD Estimated blood loss: Minimal Specimens: Endometrial biopsy Procedure: After the procedure was discussed with the patient including risks and benefits, she gave verbal consent.  She was then placed in dorsolithotomy position and a speculum was placed in the vagina.  Once the cervix was well visualized it was cleansed with Betadine x3.  An endometrial Pipelle was then  passed to a depth of 8.5 cm.  5 passes were performed with mostly blood noted, hard to assess for tisse.  This was placed in formalin.  Overall the patient tolerated the procedure well.  All instruments were removed from the vagina.  LABORATORY AND RADIOLOGIC DATA:  Outside medical records were reviewed to synthesize the above history, along with the history and physical obtained during the visit.   No results found for: "WBC", "HGB", "HCT", "PLT", "GLUCOSE", "CHOL", "TRIG", "HDL", "LDLDIRECT", "LDLCALC", "ALT", "AST", "NA", "K", "CL", "CREATININE", "BUN", "CO2", "TSH", "PSA", "INR", "GLUF", "HGBA1C", "MICROALBUR"  

## 2023-02-13 ENCOUNTER — Inpatient Hospital Stay: Payer: Managed Care, Other (non HMO) | Attending: Gynecologic Oncology | Admitting: Gynecologic Oncology

## 2023-02-13 ENCOUNTER — Inpatient Hospital Stay: Payer: Managed Care, Other (non HMO) | Admitting: Gynecologic Oncology

## 2023-02-13 ENCOUNTER — Other Ambulatory Visit: Payer: Self-pay

## 2023-02-13 ENCOUNTER — Encounter: Payer: Self-pay | Admitting: Gynecologic Oncology

## 2023-02-13 VITALS — BP 126/73 | HR 87 | Resp 16 | Ht 64.57 in | Wt 165.6 lb

## 2023-02-13 DIAGNOSIS — N949 Unspecified condition associated with female genital organs and menstrual cycle: Secondary | ICD-10-CM

## 2023-02-13 DIAGNOSIS — N924 Excessive bleeding in the premenopausal period: Secondary | ICD-10-CM | POA: Insufficient documentation

## 2023-02-13 DIAGNOSIS — Z8049 Family history of malignant neoplasm of other genital organs: Secondary | ICD-10-CM | POA: Insufficient documentation

## 2023-02-13 DIAGNOSIS — N950 Postmenopausal bleeding: Secondary | ICD-10-CM

## 2023-02-13 DIAGNOSIS — M069 Rheumatoid arthritis, unspecified: Secondary | ICD-10-CM | POA: Diagnosis not present

## 2023-02-13 DIAGNOSIS — R9389 Abnormal findings on diagnostic imaging of other specified body structures: Secondary | ICD-10-CM | POA: Insufficient documentation

## 2023-02-13 DIAGNOSIS — Z79818 Long term (current) use of other agents affecting estrogen receptors and estrogen levels: Secondary | ICD-10-CM | POA: Diagnosis not present

## 2023-02-13 NOTE — Patient Instructions (Addendum)
Today you had an endometrial biopsy. You will be contacted with the results when available. The procedure listed below may change based on the results of your endometrial biopsy from today.  Preparing for your Surgery  Plan for surgery on March 04, 2023 with Dr. Eugene Garnet at [facility]. You will be scheduled for possible dilation and curettage of the uterus, possible hysteroscopy (looking into the uterus with a camera during the [facility]), possible robotic assisted total laparoscopic hysterectomy (removal of the uterus and cervix), bilateral salpingo-oophorectomy (removal of both ovaries and fallopian tubes), possible sentinel lymph node biopsy, possible lymph node dissection, possible laparotomy (larger incision on your abdomen if needed).   Pre-operative Testing -You will receive a phone call from presurgical testing at [facility] to arrange for a pre-operative appointment and lab work.  -Bring your insurance card, copy of an advanced directive if applicable, medication list  -At that visit, you will be asked to sign a consent for a possible blood transfusion in case a transfusion becomes necessary during surgery.  The need for a blood transfusion is rare but having consent is a necessary part of your care.     -You should not be taking blood thinners or aspirin at least ten days prior to surgery unless instructed by your surgeon.  -Do not take supplements such as fish oil (omega 3), red yeast rice, turmeric before your surgery. You want to avoid medications with aspirin in them including headache powders such as BC or Goody's), Excedrin migraine.  Day Before Surgery at Home -You will be asked to take in a light diet the day before surgery. You will be advised you can have clear liquids up until 3 hours before your surgery.    Eat a light diet the day before surgery.  Examples including soups, broths, toast, yogurt, mashed potatoes.  AVOID GAS PRODUCING FOODS AND BEVERAGES.  Things to avoid include carbonated beverages (fizzy beverages, sodas), raw fruits and raw vegetables (uncooked), or beans.   If your bowels are filled with gas, your surgeon will have difficulty visualizing your pelvic organs which increases your surgical risks.  Your role in recovery Your role is to become active as soon as directed by your doctor, while still giving yourself time to heal.  Rest when you feel tired. You will be asked to do the following in order to speed your recovery:  - Cough and breathe deeply. This helps to clear and expand your lungs and can prevent pneumonia after surgery.  - STAY ACTIVE WHEN YOU GET HOME. Do mild physical activity. Walking or moving your legs help your circulation and body functions return to normal. Do not try to get up or walk alone the first time after surgery.   -If you develop swelling on one leg or the other, pain in the back of your leg, redness/warmth in one of your legs, please call the office or go to the Emergency Room to have a doppler to rule out a blood clot. For shortness of breath, chest pain-seek care in the Emergency Room as soon as possible. - Actively manage your pain. Managing your pain lets you move in comfort. We will ask you to rate your pain on a scale of zero to 10. It is your responsibility to tell your doctor or nurse where and how much you hurt so your pain can be treated.  Special Considerations -If you are diabetic, you may be placed on insulin after surgery to have closer control over  your blood sugars to promote healing and recovery.  This does not mean that you will be discharged on insulin.  If applicable, your oral antidiabetics will be resumed when you are tolerating a solid diet.  -Your final pathology results from surgery should be available around one week after surgery and the results will be relayed to you when available.  -FMLA forms can be faxed to [phone] and please allow 5-7 business days for  completion.  Pain Management After Surgery -If you have a hysterectomy, you will be prescribed pain medication to use as needed.   -Make sure that you have Tylenol and Ibuprofen IF YOU ARE ABLE TO TAKE THESE MEDICATIONS at home to use on a regular basis after surgery for pain control. We recommend alternating the medications every hour to six hours since they work differently and are processed in the body differently for pain relief.  -Review the attached handout on narcotic use and their risks and side effects.   Bowel Regimen -If you have a hysterectomy, you have been prescribed Sennakot-S to take nightly to prevent constipation especially if you are taking the narcotic pain medication intermittently.  It is important to prevent constipation and drink adequate amounts of liquids. You can stop taking this medication when you are not taking pain medication and you are back on your normal bowel routine.  Risks of Surgery Risks of surgery are low but include bleeding, infection, damage to surrounding structures, re-operation, blood clots, and very rarely death.   Blood Transfusion Information (For the consent to be signed before surgery)  We will be checking your blood type before surgery so in case of emergencies, we will know what type of blood you would need.                                            WHAT IS A BLOOD TRANSFUSION?  A transfusion is the replacement of blood or some of its parts. Blood is made up of multiple cells which provide different functions. Red blood cells carry oxygen and are used for blood loss replacement. White blood cells fight against infection. Platelets control bleeding. Plasma helps clot blood. Other blood products are available for specialized needs, such as hemophilia or other clotting disorders. BEFORE THE TRANSFUSION  Who gives blood for transfusions?  You may be able to donate blood to be used at a later date on yourself (autologous  donation). Relatives can be asked to donate blood. This is generally not any safer than if you have received blood from a stranger. The same precautions are taken to ensure safety when a relative's blood is donated. Healthy volunteers who are fully evaluated to make sure their blood is safe. This is blood bank blood. Transfusion therapy is the safest it has ever been in the practice of medicine. Before blood is taken from a donor, a complete history is taken to make sure that person has no history of diseases nor engages in risky social behavior (examples are intravenous drug use or sexual activity with multiple partners). The donor's travel history is screened to minimize risk of transmitting infections, such as malaria. The donated blood is tested for signs of infectious diseases, such as HIV and hepatitis. The blood is then tested to be sure it is compatible with you in order to minimize the chance of a transfusion reaction. If you or a relative  donates blood, this is often done in anticipation of surgery and is not appropriate for emergency situations. It takes many days to process the donated blood. RISKS AND COMPLICATIONS Although transfusion therapy is very safe and saves many lives, the main dangers of transfusion include:  Getting an infectious disease. Developing a transfusion reaction. This is an allergic reaction to something in the blood you were given. Every precaution is taken to prevent this. The decision to have a blood transfusion has been considered carefully by your caregiver before blood is given. Blood is not given unless the benefits outweigh the risks.  AFTER SURGERY INSTRUCTIONS  Return to work: 4-6 weeks if applicable  Activity: 1. Be up and out of the bed during the day.  Take a nap if needed.  You may walk up steps but be careful and use the hand rail.  Stair climbing will tire you more than you think, you may need to stop part way and rest.   2. No lifting or straining  for 6 weeks over 10 pounds. No pushing, pulling, straining for 6 weeks (if you have a hysterectomy).  3. No driving for around 1 week(s) if you have a hysterectomy, for 24 hours if you have a D&C.  Do not drive if you are taking narcotic pain medicine and make sure that your reaction time has returned.   4. You can shower as soon as the next day after surgery. Shower daily.  Use your regular soap and water (not directly on the incision) and pat your incision(s) dry afterwards; don't rub.  No tub baths or submerging your body in water until cleared by your surgeon. If you have the soap that was given to you by pre-surgical testing that was used before surgery, you do not need to use it afterwards because this can irritate your incisions.   5. No sexual activity and nothing in the vagina for 10-12 weeks if you have a hysterectomy, 2 weeks minimum if you have a D&C only.  6. (If you have a hysterectomy) You may experience a small amount of clear drainage from your incisions, which is normal.  If the drainage persists, increases, or changes color please call the office.  7. Do not use creams, lotions, or ointments such as neosporin on your incisions after surgery until advised by your surgeon because they can cause removal of the dermabond glue on your incisions.    8. You may experience vaginal spotting after surgery or around the 6-8 week mark from surgery when the stitches at the top of the vagina begin to dissolve (if you have a hysterectomy).  The spotting is normal but if you experience heavy bleeding, call our office.  9. Take Tylenol or ibuprofen first for pain if you are able to take these medications and only use narcotic pain medication for severe pain not relieved by the Tylenol or Ibuprofen.  Monitor your Tylenol intake to a max of 4,000 mg in a 24 hour period. You can alternate these medications after surgery.  Diet: 1. Low sodium Heart Healthy Diet is recommended but you are cleared to  resume your normal (before surgery) diet after your procedure.  2. It is safe to use a laxative, such as Miralax or Colace, if you have difficulty moving your bowels. You will be prescribed Sennakot-S if you have a hysterectomy to take at bedtime every evening after surgery to keep bowel movements regular and to prevent constipation.    Wound Care: 1. Keep clean  and dry.  Shower daily.  Reasons to call the Doctor: Fever - Oral temperature greater than 100.4 degrees Fahrenheit Foul-smelling vaginal discharge Difficulty urinating Nausea and vomiting Increased pain at the site of the incision that is unrelieved with pain medicine. Difficulty breathing with or without chest pain New calf pain especially if only on one side Sudden, continuing increased vaginal bleeding with or without clots.   Contacts: For questions or concerns you should contact:  Dr. Eugene Garnet at [phone]  Warner Mccreedy, NP at [phone]  After Hours: call [phone] and have the GYN Oncologist paged/contacted (after 5 pm or on the weekends). You will speak with an after hours RN and let he or she know you have had surgery.  Messages sent via mychart are for non-urgent matters and are not responded to after hours so for urgent needs, please call the after hours number.

## 2023-02-13 NOTE — Progress Notes (Deleted)
   Established Patient Office Visit  Subjective   Patient ID: Autumn Russo, female    DOB: Nov 18, 1970  Age: 52 y.o. MRN: [id number]  No chief complaint on file.   HPI  {History (Optional):23778}  ROS    Objective:     There were no vitals taken for this visit. {Vitals History (Optional):23777}  Physical Exam   No results found for any visits on 02/13/23.  {Labs (Optional):23779}  The ASCVD Risk score (Arnett DK, et al., 2019) failed to calculate for the following reasons:   Cannot find a previous HDL lab   Cannot find a previous total cholesterol lab    Assessment & Plan:   Problem List Items Addressed This Visit   None   No follow-ups on file.    Eduardo Osier, RN

## 2023-02-13 NOTE — Progress Notes (Unsigned)
Patient here for a pre-operative appointment prior to her scheduled surgery on 03/04/2023. She is scheduled for a  possible dilation and curettage of the uterus, possible hysteroscopy, possible robotic assisted total laparoscopic hysterectomy, bilateral salpingo-oophorectomy, possible sentinel lymph node biopsy, possible lymph node dissection, possible laparotomy. The surgery was discussed in detail.  See after visit summary for additional details. Visual aids used to discuss items related to surgery including the incentive spirometer, sequential compression stockings, foley catheter, IV pump, multi-modal pain regimen including tylenol, photo of the surgical robot, female reproductive system to discuss surgery in detail.      Discussed post-op pain management in detail including the aspects of the enhanced recovery pathway.  Advised her that a new prescription would be sent in and it is only to be used for after her upcoming surgery.  We discussed the use of tylenol post-op and to monitor for a maximum of 4,000 mg in a 24 hour period.  Also prescribed sennakot to be used after surgery and to hold if having loose stools.  Discussed bowel regimen in detail.     Discussed the use of SCDs and measures to take at home to prevent DVT including frequent mobility.  Reportable signs and symptoms of DVT discussed. Post-operative instructions discussed and expectations for after surgery. Incisional care discussed as well including reportable signs and symptoms including erythema, drainage, wound separation.     30 minutes spent with the patient.  Verbalizing understanding of material discussed. No needs or concerns voiced at the end of the visit.   Advised patient to call for any needs.  Advised that her post-operative medications had been prescribed and could be picked up at any time.    This appointment is included in the global surgical bundle as pre-operative teaching and has no charge.

## 2023-02-17 ENCOUNTER — Telehealth: Payer: Self-pay | Admitting: Gynecologic Oncology

## 2023-02-17 LAB — SURGICAL PATHOLOGY

## 2023-02-17 NOTE — Telephone Encounter (Signed)
Attempted to call patient with recent endometrial biopsy results obtained at her consultation in our office.  Left message asking patient to please call the office back when available.  Will also have our office to reach out to her gynecologist, Dr. Claiborne Billings, to see if the results have returned from her biopsy that was performed there as well.  Update: Patient returned call to the office.  Discussed the results of her endometrial biopsy showing inactive endometrium negative for atypia or malignancy.  Advised patient we are reaching out to Dr. Delray Alt office as well to see about the results from the biopsy she had prior to her appointment.  Advised patient that recommendations would be discussed once we have these results with the plan leaning towards a D&C with hysteroscopy.

## 2023-02-18 ENCOUNTER — Telehealth: Payer: Self-pay | Admitting: Gynecologic Oncology

## 2023-02-18 ENCOUNTER — Encounter: Payer: Self-pay | Admitting: Gynecologic Oncology

## 2023-02-18 NOTE — Telephone Encounter (Signed)
Received endometrial biopsy results from Dr. Delray Alt office.  The patient's endometrial biopsy taken on February 12, 2023 showed weakly proliferative pattern endometrium with focal breakdown possible endometrial polyp.  No evidence of unopposed estrogen effect (hyperplasia) or malignancy.  Attempted to call patient to discuss.  Left message asking her to contact the office when available.

## 2023-02-18 NOTE — Telephone Encounter (Signed)
Patient returned call to the office.  Discussed results of endometrial biopsy from Dr. Delray Alt office.  Per Dr. Winferd Humphrey consult note: If biopsy shows benign endometrium, I suggest that we plan for hysteroscopy with D&C to further sample the endometrium and rule out any endometrial pathology.  Discussed the above from Dr. Winferd Humphrey recent consultation note.  The patient is asking about recommendations in regards to the ovarian cyst that was noted as well.  Advised patient that she would be contacted with further recommendations in regards to this when Dr. Pricilla Holm is available.  Patient verbalizing understanding.  No questions voiced.

## 2023-02-18 NOTE — Progress Notes (Signed)
Sent message, via epic in basket, requesting orders in epic from surgeon.  

## 2023-02-23 ENCOUNTER — Encounter: Payer: Self-pay | Admitting: Oncology

## 2023-02-23 ENCOUNTER — Other Ambulatory Visit: Payer: Self-pay | Admitting: Gynecologic Oncology

## 2023-02-23 ENCOUNTER — Telehealth: Payer: Self-pay | Admitting: Oncology

## 2023-02-23 DIAGNOSIS — R9389 Abnormal findings on diagnostic imaging of other specified body structures: Secondary | ICD-10-CM

## 2023-02-23 DIAGNOSIS — N924 Excessive bleeding in the premenopausal period: Secondary | ICD-10-CM

## 2023-02-23 DIAGNOSIS — N83201 Unspecified ovarian cyst, right side: Secondary | ICD-10-CM

## 2023-02-23 MED ORDER — MEGESTROL ACETATE 40 MG PO TABS
40.0000 mg | ORAL_TABLET | Freq: Every day | ORAL | 1 refills | Status: DC
Start: 2023-02-23 — End: 2023-03-04

## 2023-02-23 NOTE — Progress Notes (Signed)
I called and spoke with her about EMB and reason for TVUS again prior to surgery (if D&C specimen on day of surgery also benign, ultrasound would dictate whether any need for intra-abdominal surgery). If no indication for intra-abdominal surgery and hysterectomy, we discussed using Mirena IUD to help prevent further bleeding until menopausal.  Clydie Braun - could you work on adding this to her surgery as possible Mirena IUD placement so that we can get insurance authorization? Thank you

## 2023-02-23 NOTE — Telephone Encounter (Signed)
Called Autumn Russo and advised her that Dr. Pricilla Holm would like her to have a pelvic ultrasound before surgery.  Advised the [place] has been scheduled for 03/02/23 at 11:00 with 10:45 arrival.  Gave her instructions to drink 32 oz of fluid 1 hour before the [place].  She verbalized understanding and agreement.

## 2023-02-24 NOTE — Patient Instructions (Addendum)
DUE TO COVID-19 ONLY TWO VISITORS  (aged 53 and older)  ARE ALLOWED TO COME WITH YOU AND STAY IN THE WAITING ROOM ONLY DURING PRE OP AND PROCEDURE.   **NO VISITORS ARE ALLOWED IN THE SHORT STAY AREA OR RECOVERY ROOM!!**  IF YOU WILL BE ADMITTED INTO THE HOSPITAL YOU ARE ALLOWED ONLY FOUR SUPPORT PEOPLE DURING VISITATION HOURS ONLY (7 AM -8PM)   The support person(s) must pass our screening, gel in and out, and wear a mask at all times, including in the patient's room. Patients must also wear a mask when staff or their support person are in the room. Visitors GUEST BADGE MUST BE WORN VISIBLY  One adult visitor may remain with you overnight and MUST be in the room by 8 P.M.     Your procedure is scheduled on: 03/04/23   Report to [facility] Main Entrance    Report to admitting at : 7:45 AM   Call this number if you have problems the morning of surgery [phone]   Do not eat food :After Midnight.   After Midnight you may have the following liquids until : 6:50 AM DAY OF SURGERY  Water Black Coffee (sugar ok, NO MILK/CREAM OR CREAMERS)  Tea (sugar ok, NO MILK/CREAM OR CREAMERS) regular and decaf                             Plain Jell-O (NO RED)                                           Fruit ices (not with fruit pulp, NO RED)                                     Popsicles (NO RED)                                                                  Juice: apple, WHITE grape, WHITE cranberry Sports drinks like Gatorade (NO RED)               Oral Hygiene is also important to reduce your risk of infection.                                    Remember - BRUSH YOUR TEETH THE MORNING OF SURGERY WITH YOUR REGULAR TOOTHPASTE  DENTURES WILL BE REMOVED PRIOR TO SURGERY PLEASE DO NOT APPLY "Poly grip" OR ADHESIVES!!!   Do NOT smoke after Midnight   Take these medicines the morning of surgery with A SIP OF WATER: megestrol (megace).                              You may not have any  metal on your body including hair pins, jewelry, and body piercing             Do not wear make-up, lotions, powders, perfumes/cologne, or deodorant  Do not wear nail polish including gel and S&S, artificial/acrylic nails, or any other type of covering on natural nails including finger and toenails. If you have artificial nails, gel coating, etc. that needs to be removed by a nail salon please have this removed prior to surgery or surgery may need to be canceled/ delayed if the surgeon/ anesthesia feels like they are unable to be safely monitored.   Do not shave  48 hours prior to surgery.    Do not bring valuables to the hospital. [place].   Contacts, glasses, or bridgework may not be worn into surgery.   Bring small overnight bag day of surgery.   DO NOT [place]. PHARMACY WILL DISPENSE MEDICATIONS LISTED ON YOUR MEDICATION LIST TO YOU DURING YOUR ADMISSION [place]!    Patients discharged on the day of surgery will not be allowed to drive home.  Someone NEEDS to stay with you for the first 24 hours after anesthesia.   Special Instructions: Bring a copy of your healthcare power of attorney and living will documents         the day of surgery if you haven't scanned them before.              Please read over the following fact sheets you were given: IF YOU HAVE QUESTIONS ABOUT YOUR PRE-OP INSTRUCTIONS PLEASE CALL [phone]    [facility] - Preparing for Surgery Before surgery, you can play an important role.  Because skin is not sterile, your skin needs to be as free of germs as possible.  You can reduce the number of germs on your skin by washing with CHG (chlorahexidine gluconate) soap before surgery.  CHG is an antiseptic cleaner which kills germs and bonds with the skin to continue killing germs even after washing. Please DO NOT use if you have an allergy to CHG or antibacterial soaps.  If  your skin becomes reddened/irritated stop using the CHG and inform your nurse when you arrive at Short Stay. Do not shave (including legs and underarms) for at least 48 hours prior to the first CHG shower.  You may shave your face/neck. Please follow these instructions carefully:  1.  Shower with CHG Soap the night before surgery and the  morning of Surgery.  2.  If you choose to wash your hair, wash your hair first as usual with your  normal  shampoo.  3.  After you shampoo, rinse your hair and body thoroughly to remove the  shampoo.                           4.  Use CHG as you would any other liquid soap.  You can apply chg directly  to the skin and wash                       Gently with a scrungie or clean washcloth.  5.  Apply the CHG Soap to your body ONLY FROM THE NECK DOWN.   Do not use on face/ open                           Wound or open sores. Avoid contact with eyes, ears mouth and genitals (private parts).  Wash face,  Genitals (private parts) with your normal soap.             6.  Wash thoroughly, paying special attention to the area where your surgery  will be performed.  7.  Thoroughly rinse your body with warm water from the neck down.  8.  DO NOT shower/wash with your normal soap after using and rinsing off  the CHG Soap.                9.  Pat yourself dry with a clean towel.            10.  Wear clean pajamas.            11.  Place clean sheets on your bed the night of your first shower and do not  sleep with pets. Day of Surgery : Do not apply any lotions/deodorants the morning of surgery.  Please wear clean clothes to the hospital/surgery center.  FAILURE TO FOLLOW THESE INSTRUCTIONS MAY RESULT IN THE CANCELLATION OF YOUR SURGERY PATIENT SIGNATURE_________________________________  NURSE SIGNATURE__________________________________  ________________________________________________________________________

## 2023-02-25 ENCOUNTER — Encounter (HOSPITAL_COMMUNITY): Payer: Self-pay

## 2023-02-25 ENCOUNTER — Encounter (HOSPITAL_COMMUNITY)
Admission: RE | Admit: 2023-02-25 | Discharge: 2023-02-25 | Disposition: A | Discharge status: Home / Self Care | Payer: Managed Care, Other (non HMO) | Source: Ambulatory Visit | Attending: Gynecologic Oncology | Admitting: Gynecologic Oncology

## 2023-02-25 ENCOUNTER — Telehealth: Payer: Self-pay | Admitting: Gynecologic Oncology

## 2023-02-25 ENCOUNTER — Other Ambulatory Visit: Payer: Self-pay

## 2023-02-25 VITALS — BP 113/75 | HR 100 | Temp 98.3°F | Ht 64.5 in

## 2023-02-25 DIAGNOSIS — Z01818 Encounter for other preprocedural examination: Secondary | ICD-10-CM | POA: Diagnosis present

## 2023-02-25 NOTE — Telephone Encounter (Signed)
EMB from OBGYN's office on 6/13: weakly proliferative endometrium with focal breakdown, possible polyp. No evidence of unopposed estrogen effect, hyperplasia, or malignancy.

## 2023-02-26 ENCOUNTER — Telehealth: Payer: Self-pay | Admitting: Surgery

## 2023-02-26 NOTE — Telephone Encounter (Signed)
Called patient back and relayed message from Dr Pricilla Holm. Patient stated she would talk with her husband to make a final decision and call our office back to let us know if she wants to proceed with ultrasound prior to [facility] or wait for 3 months after. Patient also wanted to know if she decides to wait, could the ultrasound then be done at her ob/gyn or would it still need to be done at the hospital?

## 2023-02-26 NOTE — Telephone Encounter (Signed)
We were using the ultrasound to get another look at that ovary.  If the D&C shows benign tissue and the ovary is overall reassuring or normal, then we would not need to do any intra-abdominal surgery.  If the ovary has a complex mass, then we had discussed the need for surgery to remove at least the 1 tube and ovary.  If the patient would like to wait, that would be fine.  We could plan to do the D&C first and await the results.  We could always repeat an ultrasound in about 3 months to look at that ovary.

## 2023-02-26 NOTE — Telephone Encounter (Signed)
Patient called wanting to know if her recent transvaginal ultrasound from Dr Delray Alt office can be used instead of her needing an additional ultrasound the day before her D&C. Patient voiced concerns over costs. Advised patient that our office would check with Dr Pricilla Holm if this would be acceptable and if so request records from Dr Delray Alt office.

## 2023-02-26 NOTE — Telephone Encounter (Signed)
I'd really prefer it to be done at the hospital. I can't see the images from the OBGYN's office unfortunately.

## 2023-02-27 NOTE — Telephone Encounter (Signed)
Called patient to let her know, per Dr Pricilla Holm, ultrasound will need to be done at the hospital. Patient verbalized understanding and stated she would proceed with scheduled ultrasound on 7/1 instead of waiting for 3 months after the D&C. No other concerns at this time.

## 2023-02-27 NOTE — Telephone Encounter (Signed)
Thank you :)

## 2023-03-02 ENCOUNTER — Telehealth: Payer: Self-pay | Admitting: Gynecologic Oncology

## 2023-03-02 ENCOUNTER — Ambulatory Visit (HOSPITAL_COMMUNITY)
Admission: RE | Admit: 2023-03-02 | Discharge: 2023-03-02 | Disposition: A | Discharge status: Home / Self Care | Payer: Managed Care, Other (non HMO) | Source: Ambulatory Visit | Attending: Gynecologic Oncology | Admitting: Gynecologic Oncology

## 2023-03-02 DIAGNOSIS — N83201 Unspecified ovarian cyst, right side: Secondary | ICD-10-CM | POA: Insufficient documentation

## 2023-03-02 NOTE — Telephone Encounter (Signed)
Called patient, discussed ultrasound. Reviewed possibilities for Wednesday. Most conservative would be to proceed with hysteroscopy, D&C and send for frozen - if benign , could continue oral progesterone or place Mirena IUD. Could plan to repeat ultrasound in 6-12 weeks to assess ovary. Overall reassuring that cyst vs ovary measurement has decreased in size since recent OBGYN visit. Also discussed possibility, given family history and appearance of endometrium, that we could proceed with TH/BS and plan to send the uterus for frozen section to guide need for any additional surgery. One or both ovaries could be left in situ if normal at time of surgery versus could perform BSO with plan for HRT. All of patient's questions were answered. I suggested she take the night to think about options and call my office tomorrow am to let me know how she'd like to proceed.  Options: - hysteroscopy, D&C, possible Mirena IUD insertion, possible TRH/BSO (pending frozen section) - TRH/BS vs BSO with plan for frozen section  Eugene Garnet MD Gynecologic Oncology

## 2023-03-03 ENCOUNTER — Telehealth: Payer: Self-pay | Admitting: *Deleted

## 2023-03-03 ENCOUNTER — Encounter: Payer: Self-pay | Admitting: Gynecologic Oncology

## 2023-03-03 ENCOUNTER — Other Ambulatory Visit: Payer: Self-pay | Admitting: Gynecologic Oncology

## 2023-03-03 DIAGNOSIS — N924 Excessive bleeding in the premenopausal period: Secondary | ICD-10-CM

## 2023-03-03 DIAGNOSIS — N949 Unspecified condition associated with female genital organs and menstrual cycle: Secondary | ICD-10-CM

## 2023-03-03 DIAGNOSIS — R9389 Abnormal findings on diagnostic imaging of other specified body structures: Secondary | ICD-10-CM

## 2023-03-03 MED ORDER — SENNOSIDES-DOCUSATE SODIUM 8.6-50 MG PO TABS
2.0000 | ORAL_TABLET | Freq: Every day | ORAL | 0 refills | Status: AC
Start: 2023-03-03 — End: ?

## 2023-03-03 MED ORDER — TRAMADOL HCL 50 MG PO TABS
50.0000 mg | ORAL_TABLET | Freq: Four times a day (QID) | ORAL | 0 refills | Status: AC | PRN
Start: 2023-03-03 — End: ?

## 2023-03-03 MED ORDER — IBUPROFEN 800 MG PO TABS
800.0000 mg | ORAL_TABLET | Freq: Three times a day (TID) | ORAL | 0 refills | Status: AC | PRN
Start: 2023-03-03 — End: ?

## 2023-03-03 NOTE — Telephone Encounter (Signed)
-----   Message from Doylene Bode, NP sent at 03/03/2023  1:50 PM EDT ----- Please call surgical short stay and let them know the patient will need to have a type and screen drawn in the am on the surgery day tomorrow. She also has an updated consent for TH, BSO, poss stag so they need to make sure this is correct.   Thanks  Their number is [phone]

## 2023-03-03 NOTE — Telephone Encounter (Signed)
-----   Message from Melissa D Cross, NP sent at 03/03/2023  1:50 PM EDT ----- Please call surgical short stay and let them know the patient will need to have a type and screen drawn in the am on the surgery day tomorrow. She also has an updated consent for TH, BSO, poss stag so they need to make sure this is correct.   Thanks  Their number is [phone]  

## 2023-03-03 NOTE — Progress Notes (Signed)
Post-op meds sent in preop.  ?

## 2023-03-03 NOTE — Telephone Encounter (Signed)
Spoke with Synetta Fail at [facility] in regards to Ms. Shubert's  surgery tomorrow. Patient will need a type and screen drawn in the morning prior to her surgery and an updated consent for TH, BSO, and possible staging. Orders for type & screen are confirmed and updated consent will be completed.

## 2023-03-03 NOTE — Telephone Encounter (Signed)
Telephone call to check on pre-operative status.  Patient compliant with pre-operative instructions.  Reinforced nothing to eat after midnight. Clear liquids until 0745. Patient to arrive at 0845.  No questions or concerns voiced.  Instructed to call for any needs. Patient has decided she wants the hysterectomy with bilateral salpingo oophorectomy. Advised patient that her message would be forwarded to Dr. Pricilla Holm and post op meds stool softener/laxative and pain medication would be sent to her pharmacy for pick up later today.

## 2023-03-04 ENCOUNTER — Encounter (HOSPITAL_COMMUNITY)
Admission: RE | Disposition: A | Discharge status: Home / Self Care | Payer: Self-pay | Source: Ambulatory Visit | Attending: Gynecologic Oncology

## 2023-03-04 ENCOUNTER — Ambulatory Visit (HOSPITAL_COMMUNITY): Payer: Managed Care, Other (non HMO) | Admitting: Anesthesiology

## 2023-03-04 ENCOUNTER — Other Ambulatory Visit: Payer: Self-pay

## 2023-03-04 ENCOUNTER — Ambulatory Visit (HOSPITAL_BASED_OUTPATIENT_CLINIC_OR_DEPARTMENT_OTHER): Payer: Managed Care, Other (non HMO) | Admitting: Anesthesiology

## 2023-03-04 ENCOUNTER — Encounter (HOSPITAL_COMMUNITY): Payer: Self-pay | Admitting: Gynecologic Oncology

## 2023-03-04 ENCOUNTER — Ambulatory Visit (HOSPITAL_COMMUNITY)
Admission: RE | Admit: 2023-03-04 | Discharge: 2023-03-04 | Disposition: A | Discharge status: Home / Self Care | Payer: Managed Care, Other (non HMO) | Source: Ambulatory Visit | Attending: Gynecologic Oncology | Admitting: Gynecologic Oncology

## 2023-03-04 DIAGNOSIS — N201 Calculus of ureter: Secondary | ICD-10-CM | POA: Diagnosis not present

## 2023-03-04 DIAGNOSIS — E785 Hyperlipidemia, unspecified: Secondary | ICD-10-CM | POA: Diagnosis not present

## 2023-03-04 DIAGNOSIS — M069 Rheumatoid arthritis, unspecified: Secondary | ICD-10-CM | POA: Insufficient documentation

## 2023-03-04 DIAGNOSIS — N8003 Adenomyosis of the uterus: Secondary | ICD-10-CM | POA: Diagnosis not present

## 2023-03-04 DIAGNOSIS — R9389 Abnormal findings on diagnostic imaging of other specified body structures: Secondary | ICD-10-CM

## 2023-03-04 DIAGNOSIS — N83209 Unspecified ovarian cyst, unspecified side: Secondary | ICD-10-CM

## 2023-03-04 DIAGNOSIS — Z09 Encounter for follow-up examination after completed treatment for conditions other than malignant neoplasm: Secondary | ICD-10-CM | POA: Diagnosis not present

## 2023-03-04 DIAGNOSIS — Z8049 Family history of malignant neoplasm of other genital organs: Secondary | ICD-10-CM | POA: Insufficient documentation

## 2023-03-04 DIAGNOSIS — I10 Essential (primary) hypertension: Secondary | ICD-10-CM | POA: Diagnosis not present

## 2023-03-04 DIAGNOSIS — N924 Excessive bleeding in the premenopausal period: Secondary | ICD-10-CM | POA: Insufficient documentation

## 2023-03-04 DIAGNOSIS — Z9889 Other specified postprocedural states: Secondary | ICD-10-CM | POA: Insufficient documentation

## 2023-03-04 DIAGNOSIS — N939 Abnormal uterine and vaginal bleeding, unspecified: Secondary | ICD-10-CM

## 2023-03-04 DIAGNOSIS — E119 Type 2 diabetes mellitus without complications: Secondary | ICD-10-CM

## 2023-03-04 DIAGNOSIS — Z87891 Personal history of nicotine dependence: Secondary | ICD-10-CM | POA: Diagnosis not present

## 2023-03-04 DIAGNOSIS — D270 Benign neoplasm of right ovary: Secondary | ICD-10-CM | POA: Diagnosis not present

## 2023-03-04 HISTORY — DX: Abnormal findings on diagnostic imaging of other specified body structures: R93.89

## 2023-03-04 HISTORY — PX: ROBOTIC ASSISTED TOTAL HYSTERECTOMY WITH BILATERAL SALPINGO OOPHERECTOMY: SHX6086

## 2023-03-04 LAB — POCT PREGNANCY, URINE: Preg Test, Ur: NEGATIVE

## 2023-03-04 LAB — TYPE AND SCREEN: ABO/RH(D): B POS

## 2023-03-04 SURGERY — HYSTERECTOMY, TOTAL, ROBOT-ASSISTED, LAPAROSCOPIC, WITH BILATERAL SALPINGO-OOPHORECTOMY
Anesthesia: General

## 2023-03-04 MED ORDER — AMISULPRIDE (ANTIEMETIC) 5 MG/2ML IV SOLN
INTRAVENOUS | Status: AC
  Filled 2023-03-04: qty 2

## 2023-03-04 MED ORDER — OXYCODONE HCL 5 MG PO TABS: 5.0000 mg | ORAL_TABLET | Freq: Once | ORAL | Status: AC | PRN

## 2023-03-04 MED ORDER — STERILE WATER FOR IRRIGATION IR SOLN
Status: DC | PRN
  Administered 2023-03-04: 1000 mL

## 2023-03-04 MED ORDER — OXYCODONE HCL 5 MG/5ML PO SOLN: 5.0000 mg | Freq: Once | ORAL | Status: AC | PRN

## 2023-03-04 MED ORDER — ESTRADIOL 0.05 MG/24HR TD PTWK: 0.0500 mg | MEDICATED_PATCH | TRANSDERMAL | 3 refills | Status: AC

## 2023-03-04 MED ORDER — LIDOCAINE HCL (PF) 2 % IJ SOLN
INTRAMUSCULAR | Status: AC
  Filled 2023-03-04: qty 15

## 2023-03-04 MED ORDER — LIDOCAINE HCL (PF) 2 % IJ SOLN
INTRAMUSCULAR | Status: AC
  Filled 2023-03-04: qty 5

## 2023-03-04 MED ORDER — SUGAMMADEX SODIUM 200 MG/2ML IV SOLN
INTRAVENOUS | Status: DC | PRN
  Administered 2023-03-04: 200 mg via INTRAVENOUS

## 2023-03-04 MED ORDER — SCOPOLAMINE 1 MG/3DAYS TD PT72
1.0000 | MEDICATED_PATCH | TRANSDERMAL | Status: DC
  Administered 2023-03-04: 1.5 mg via TRANSDERMAL
  Filled 2023-03-04: qty 1

## 2023-03-04 MED ORDER — HEPARIN SODIUM (PORCINE) 5000 UNIT/ML IJ SOLN
INTRAMUSCULAR | Status: AC
  Filled 2023-03-04: qty 1

## 2023-03-04 MED ORDER — LACTATED RINGERS IV SOLN: INTRAVENOUS | Status: DC

## 2023-03-04 MED ORDER — CEFAZOLIN SODIUM-DEXTROSE 2-4 GM/100ML-% IV SOLN
2.0000 g | INTRAVENOUS | Status: AC
  Administered 2023-03-04: 2 g via INTRAVENOUS
  Filled 2023-03-04: qty 100

## 2023-03-04 MED ORDER — ORAL CARE MOUTH RINSE: 15.0000 mL | Freq: Once | OROMUCOSAL | Status: AC

## 2023-03-04 MED ORDER — LACTATED RINGERS IV SOLN: INTRAVENOUS | Status: DC | PRN

## 2023-03-04 MED ORDER — FENTANYL CITRATE PF 50 MCG/ML IJ SOSY
PREFILLED_SYRINGE | INTRAMUSCULAR | Status: AC
  Filled 2023-03-04: qty 1

## 2023-03-04 MED ORDER — HEPARIN SODIUM (PORCINE) 5000 UNIT/ML IJ SOLN
5000.0000 [IU] | INTRAMUSCULAR | Status: AC
  Administered 2023-03-04: 5000 [IU] via SUBCUTANEOUS

## 2023-03-04 MED ORDER — FENTANYL CITRATE PF 50 MCG/ML IJ SOSY
PREFILLED_SYRINGE | INTRAMUSCULAR | Status: AC
  Administered 2023-03-04: 50 ug via INTRAVENOUS
  Filled 2023-03-04: qty 1

## 2023-03-04 MED ORDER — FENTANYL CITRATE PF 50 MCG/ML IJ SOSY
25.0000 ug | PREFILLED_SYRINGE | INTRAMUSCULAR | Status: DC | PRN
  Administered 2023-03-04: 50 ug via INTRAVENOUS

## 2023-03-04 MED ORDER — ACETAMINOPHEN 500 MG PO TABS
1000.0000 mg | ORAL_TABLET | Freq: Once | ORAL | Status: AC
  Administered 2023-03-04: 1000 mg via ORAL
  Filled 2023-03-04: qty 2

## 2023-03-04 MED ORDER — LIDOCAINE 2% (20 MG/ML) 5 ML SYRINGE
INTRAMUSCULAR | Status: DC | PRN
  Administered 2023-03-04: 60 mg via INTRAVENOUS

## 2023-03-04 MED ORDER — KETAMINE HCL 10 MG/ML IJ SOLN
INTRAMUSCULAR | Status: DC | PRN
  Administered 2023-03-04: 30 mg via INTRAVENOUS

## 2023-03-04 MED ORDER — PROPOFOL 10 MG/ML IV BOLUS
INTRAVENOUS | Status: AC
  Filled 2023-03-04: qty 20

## 2023-03-04 MED ORDER — ROCURONIUM BROMIDE 10 MG/ML (PF) SYRINGE
PREFILLED_SYRINGE | INTRAVENOUS | Status: DC | PRN
  Administered 2023-03-04 (×2): 10 mg via INTRAVENOUS
  Administered 2023-03-04: 50 mg via INTRAVENOUS
  Administered 2023-03-04: 10 mg via INTRAVENOUS

## 2023-03-04 MED ORDER — KETOROLAC TROMETHAMINE 30 MG/ML IJ SOLN
INTRAMUSCULAR | Status: DC | PRN
  Administered 2023-03-04: 15 mg via INTRAVENOUS

## 2023-03-04 MED ORDER — DEXAMETHASONE SODIUM PHOSPHATE 4 MG/ML IJ SOLN
4.0000 mg | INTRAMUSCULAR | Status: AC
  Administered 2023-03-04: 4 mg via INTRAVENOUS

## 2023-03-04 MED ORDER — CHLORHEXIDINE GLUCONATE 0.12 % MT SOLN
15.0000 mL | Freq: Once | OROMUCOSAL | Status: AC
  Administered 2023-03-04: 15 mL via OROMUCOSAL

## 2023-03-04 MED ORDER — LACTATED RINGERS IR SOLN
Status: DC | PRN
  Administered 2023-03-04: 1000 mL

## 2023-03-04 MED ORDER — ROCURONIUM BROMIDE 10 MG/ML (PF) SYRINGE
PREFILLED_SYRINGE | INTRAVENOUS | Status: AC
  Filled 2023-03-04: qty 10

## 2023-03-04 MED ORDER — ONDANSETRON HCL 4 MG/2ML IJ SOLN
INTRAMUSCULAR | Status: DC | PRN
  Administered 2023-03-04: 4 mg via INTRAVENOUS

## 2023-03-04 MED ORDER — LIDOCAINE 20MG/ML (2%) 15 ML SYRINGE OPTIME
INTRAMUSCULAR | Status: DC | PRN
  Administered 2023-03-04: 1.5 mg/kg/h via INTRAVENOUS

## 2023-03-04 MED ORDER — ACETAMINOPHEN 500 MG PO TABS: 1000.0000 mg | ORAL_TABLET | ORAL | Status: DC

## 2023-03-04 MED ORDER — MIDAZOLAM HCL 2 MG/2ML IJ SOLN
INTRAMUSCULAR | Status: AC
  Filled 2023-03-04: qty 2

## 2023-03-04 MED ORDER — FENTANYL CITRATE (PF) 250 MCG/5ML IJ SOLN
INTRAMUSCULAR | Status: DC | PRN
  Administered 2023-03-04 (×2): 100 ug via INTRAVENOUS
  Administered 2023-03-04: 50 ug via INTRAVENOUS

## 2023-03-04 MED ORDER — OXYCODONE HCL 5 MG PO TABS
ORAL_TABLET | ORAL | Status: AC
  Administered 2023-03-04: 5 mg via ORAL
  Filled 2023-03-04: qty 1

## 2023-03-04 MED ORDER — KETOROLAC TROMETHAMINE 30 MG/ML IJ SOLN
INTRAMUSCULAR | Status: AC
  Filled 2023-03-04: qty 1

## 2023-03-04 MED ORDER — AMISULPRIDE (ANTIEMETIC) 5 MG/2ML IV SOLN: 10.0000 mg | Freq: Once | INTRAVENOUS | Status: DC | PRN

## 2023-03-04 MED ORDER — PROPOFOL 10 MG/ML IV BOLUS
INTRAVENOUS | Status: DC | PRN
  Administered 2023-03-04: 140 mg via INTRAVENOUS

## 2023-03-04 MED ORDER — FENTANYL CITRATE (PF) 250 MCG/5ML IJ SOLN
INTRAMUSCULAR | Status: AC
  Filled 2023-03-04: qty 5

## 2023-03-04 MED ORDER — SCOPOLAMINE 1 MG/3DAYS TD PT72: 1.0000 | MEDICATED_PATCH | TRANSDERMAL | Status: DC

## 2023-03-04 MED ORDER — MIDAZOLAM HCL 2 MG/2ML IJ SOLN
INTRAMUSCULAR | Status: DC | PRN
  Administered 2023-03-04: 2 mg via INTRAVENOUS

## 2023-03-04 MED ORDER — BUPIVACAINE HCL 0.25 % IJ SOLN
INTRAMUSCULAR | Status: AC
  Filled 2023-03-04: qty 1

## 2023-03-04 MED ORDER — DEXAMETHASONE SODIUM PHOSPHATE 10 MG/ML IJ SOLN
INTRAMUSCULAR | Status: AC
  Filled 2023-03-04: qty 1

## 2023-03-04 MED ORDER — BUPIVACAINE HCL 0.25 % IJ SOLN
INTRAMUSCULAR | Status: DC | PRN
  Administered 2023-03-04: 30 mL

## 2023-03-04 MED ORDER — METRONIDAZOLE 500 MG/100ML IV SOLN
500.0000 mg | INTRAVENOUS | Status: AC
  Administered 2023-03-04: 500 mg via INTRAVENOUS
  Filled 2023-03-04: qty 100

## 2023-03-04 MED ORDER — ONDANSETRON HCL 4 MG/2ML IJ SOLN
INTRAMUSCULAR | Status: AC
  Filled 2023-03-04: qty 2

## 2023-03-04 MED ORDER — KETAMINE HCL 50 MG/5ML IJ SOSY
PREFILLED_SYRINGE | INTRAMUSCULAR | Status: AC
  Filled 2023-03-04: qty 5

## 2023-03-04 SURGICAL SUPPLY — 77 items
ADH SKN CLS APL DERMABOND .7 (GAUZE/BANDAGES/DRESSINGS) ×1
AGENT HMST KT MTR STRL THRMB (HEMOSTASIS)
APL ESCP 34 STRL LF DISP (HEMOSTASIS)
APPLICATOR SURGIFLO ENDO (HEMOSTASIS) IMPLANT
BAG COUNTER SPONGE SURGICOUNT (BAG) IMPLANT
BAG LAPAROSCOPIC 12 15 PORT 16 (BASKET) IMPLANT
BAG RETRIEVAL 12/15 (BASKET)
BAG SPNG CNTER NS LX DISP (BAG)
BLADE SURG SZ10 CARB STEEL (BLADE) IMPLANT
COVER BACK TABLE 60X90IN (DRAPES) ×2 IMPLANT
COVER TIP SHEARS 8 DVNC (MISCELLANEOUS) ×2 IMPLANT
DERMABOND ADVANCED .7 DNX12 (GAUZE/BANDAGES/DRESSINGS) ×2 IMPLANT
DRAPE ARM DVNC X/XI (DISPOSABLE) ×8 IMPLANT
DRAPE COLUMN DVNC XI (DISPOSABLE) ×2 IMPLANT
DRAPE SHEET LG 3/4 BI-LAMINATE (DRAPES) ×2 IMPLANT
DRAPE SURG IRRIG POUCH 19X23 (DRAPES) ×2 IMPLANT
DRIVER NDL MEGA SUTCUT DVNCXI (INSTRUMENTS) ×2 IMPLANT
DRIVER NDLE MEGA SUTCUT DVNCXI (INSTRUMENTS) ×1 IMPLANT
DRSG OPSITE POSTOP 4X6 (GAUZE/BANDAGES/DRESSINGS) IMPLANT
DRSG OPSITE POSTOP 4X8 (GAUZE/BANDAGES/DRESSINGS) IMPLANT
ELECT PENCIL ROCKER SW 15FT (MISCELLANEOUS) IMPLANT
ELECT REM PT RETURN 15FT ADLT (MISCELLANEOUS) ×2 IMPLANT
FORCEPS BPLR FENES DVNC XI (FORCEP) ×2 IMPLANT
FORCEPS PROGRASP DVNC XI (FORCEP) ×2 IMPLANT
GAUZE 4X4 16PLY ~~LOC~~+RFID DBL (SPONGE) ×4 IMPLANT
GLOVE BIO SURGEON STRL SZ 6 (GLOVE) ×8 IMPLANT
GLOVE BIO SURGEON STRL SZ 6.5 (GLOVE) ×2 IMPLANT
GOWN STRL REUS W/ TWL LRG LVL3 (GOWN DISPOSABLE) ×8 IMPLANT
GOWN STRL REUS W/TWL LRG LVL3 (GOWN DISPOSABLE) ×4
GRASPER SUT TROCAR 14GX15 (MISCELLANEOUS) IMPLANT
HOLDER FOLEY CATH W/STRAP (MISCELLANEOUS) IMPLANT
IRRIG SUCT STRYKERFLOW 2 WTIP (MISCELLANEOUS) ×1
IRRIGATION SUCT STRKRFLW 2 WTP (MISCELLANEOUS) ×2 IMPLANT
KIT PROCEDURE DVNC SI (MISCELLANEOUS) IMPLANT
KIT TURNOVER KIT A (KITS) IMPLANT
LIGASURE IMPACT 36 18CM CVD LR (INSTRUMENTS) IMPLANT
MANIPULATOR ADVINCU DEL 3.0 PL (MISCELLANEOUS) IMPLANT
MANIPULATOR ADVINCU DEL 3.5 PL (MISCELLANEOUS) IMPLANT
MANIPULATOR UTERINE 4.5 ZUMI (MISCELLANEOUS) IMPLANT
NDL HYPO 21X1.5 SAFETY (NEEDLE) ×2 IMPLANT
NDL SPNL 18GX3.5 QUINCKE PK (NEEDLE) IMPLANT
NEEDLE HYPO 21X1.5 SAFETY (NEEDLE) ×1 IMPLANT
NEEDLE SPNL 18GX3.5 QUINCKE PK (NEEDLE) IMPLANT
OBTURATOR OPTICAL STND 8 DVNC (TROCAR) ×1
OBTURATOR OPTICALSTD 8 DVNC (TROCAR) ×2 IMPLANT
PACK ROBOT GYN CUSTOM WL (TRAY / TRAY PROCEDURE) ×2 IMPLANT
PAD POSITIONING PINK XL (MISCELLANEOUS) ×2 IMPLANT
PORT ACCESS TROCAR AIRSEAL 12 (TROCAR) IMPLANT
SCISSORS MNPLR CVD DVNC XI (INSTRUMENTS) ×2 IMPLANT
SCRUB CHG 4% DYNA-HEX 4OZ (MISCELLANEOUS) IMPLANT
SEAL UNIV 5-12 XI (MISCELLANEOUS) ×8 IMPLANT
SET TRI-LUMEN FLTR TB AIRSEAL (TUBING) ×2 IMPLANT
SPIKE FLUID TRANSFER (MISCELLANEOUS) ×2 IMPLANT
SPONGE T-LAP 18X18 ~~LOC~~+RFID (SPONGE) IMPLANT
SURGIFLO W/THROMBIN 8M KIT (HEMOSTASIS) IMPLANT
SUT MNCRL AB 4-0 PS2 18 (SUTURE) IMPLANT
SUT PDS AB 1 TP1 96 (SUTURE) IMPLANT
SUT V-LOC 180 0-0 GS22 (SUTURE) IMPLANT
SUT VIC AB 0 CT1 27 (SUTURE)
SUT VIC AB 0 CT1 27XBRD ANTBC (SUTURE) IMPLANT
SUT VIC AB 2-0 CT1 27 (SUTURE)
SUT VIC AB 2-0 CT1 TAPERPNT 27 (SUTURE) IMPLANT
SUT VIC AB 4-0 PS2 18 (SUTURE) ×4 IMPLANT
SUT VICRYL 0 27 CT2 27 ABS (SUTURE) ×2 IMPLANT
SUT VLOC 180 0 9IN GS21 (SUTURE) IMPLANT
SYR 10ML LL (SYRINGE) IMPLANT
SYS BAG RETRIEVAL 10MM (BASKET)
SYS WOUND ALEXIS 18CM MED (MISCELLANEOUS)
SYSTEM BAG RETRIEVAL 10MM (BASKET) IMPLANT
SYSTEM WOUND ALEXIS 18CM MED (MISCELLANEOUS) IMPLANT
TOWEL OR NON WOVEN STRL DISP B (DISPOSABLE) IMPLANT
TRAP SPECIMEN MUCUS 40CC (MISCELLANEOUS) IMPLANT
TRAY FOLEY MTR SLVR 16FR STAT (SET/KITS/TRAYS/PACK) ×2 IMPLANT
TROCAR PORT AIRSEAL 5X120 (TROCAR) IMPLANT
UNDERPAD 30X36 HEAVY ABSORB (UNDERPADS AND DIAPERS) ×4 IMPLANT
WATER STERILE IRR 1000ML POUR (IV SOLUTION) ×2 IMPLANT
YANKAUER SUCT BULB TIP 10FT TU (MISCELLANEOUS) IMPLANT

## 2023-03-04 NOTE — Anesthesia Procedure Notes (Signed)
Procedure Name: Intubation Date/Time: 03/04/2023 10:49 AM  Performed by: Elyn Peers, CRNAPre-anesthesia Checklist: Patient identified, Emergency Drugs available, Suction available, Patient being monitored and Timeout performed Patient Re-evaluated:Patient Re-evaluated prior to induction Oxygen Delivery Method: Circle system utilized Preoxygenation: Pre-oxygenation with 100% oxygen Induction Type: IV induction Ventilation: Mask ventilation without difficulty Laryngoscope Size: Miller and 3 Grade View: Grade II Tube type: Oral Tube size: 7.0 mm Number of attempts: 1 Airway Equipment and Method: Stylet Placement Confirmation: ETT inserted through vocal cords under direct vision, positive ETCO2 and breath sounds checked- equal and bilateral Secured at: 23 cm Tube secured with: Tape Dental Injury: Teeth and Oropharynx as per pre-operative assessment  Comments: Grade II view with external laryngeal pressure.

## 2023-03-04 NOTE — Anesthesia Preprocedure Evaluation (Signed)
Anesthesia Evaluation  Patient identified by MRN, date of birth, ID band Patient awake    Reviewed: Allergy & Precautions, NPO status , Patient's Chart, lab work & pertinent test results  Airway Mallampati: II  TM Distance: >3 FB Neck ROM: Full    Dental  (+) Dental Advisory Given   Pulmonary former smoker   breath sounds clear to auscultation       Cardiovascular negative cardio ROS  Rhythm:Regular Rate:Normal     Neuro/Psych negative neurological ROS     GI/Hepatic negative GI ROS, Neg liver ROS,,,  Endo/Other  negative endocrine ROS    Renal/GU negative Renal ROS     Musculoskeletal  (+) Arthritis , Rheumatoid disorders,    Abdominal   Peds  Hematology negative hematology ROS (+)   Anesthesia Other Findings   Reproductive/Obstetrics                             Anesthesia Physical Anesthesia Plan  ASA: 2  Anesthesia Plan: General   Post-op Pain Management: Tylenol PO (pre-op)*, Toradol IV (intra-op)* and Ketamine IV*   Induction: Intravenous  PONV Risk Score and Plan: 3 and 4 or greater and Dexamethasone, Ondansetron, Midazolam, Scopolamine patch - Pre-op and Treatment may vary due to age or medical condition  Airway Management Planned: Oral ETT  Additional Equipment: None  Intra-op Plan:   Post-operative Plan: Extubation in OR  Informed Consent: I have reviewed the patients History and Physical, chart, labs and discussed the procedure including the risks, benefits and alternatives for the proposed anesthesia with the patient or authorized representative who has indicated his/her understanding and acceptance.     Dental advisory given  Plan Discussed with: CRNA  Anesthesia Plan Comments:        Anesthesia Quick Evaluation

## 2023-03-04 NOTE — Discharge Instructions (Addendum)
AFTER SURGERY INSTRUCTIONS   Return to work: 4-6 weeks if applicable, variable based on occupation   Activity: 1. Be up and out of the bed during the day.  Take a nap if needed.  You may walk up steps but be careful and use the hand rail.  Stair climbing will tire you more than you think, you may need to stop part way and rest.    2. No lifting or straining for 6 weeks over 10 pounds. No pushing, pulling, straining for 6 weeks (if you have a hysterectomy).   3. No driving for around 1 week(s).  Do not drive if you are taking narcotic pain medicine and make sure that your reaction time has returned.    4. You can shower as soon as the next day after surgery. Shower daily.  Use your regular soap and water (not directly on the incision) and pat your incision(s) dry afterwards; don't rub.  No tub baths or submerging your body in water until cleared by your surgeon. If you have the soap that was given to you by pre-surgical testing that was used before surgery, you do not need to use it afterwards because this can irritate your incisions.    5. No sexual activity and nothing in the vagina for 10-12 weeks since you will have a hysterectomy.   6. You may experience a small amount of clear drainage from your incisions, which is normal.  If the drainage persists, increases, or changes color please call the office.   7. Do not use creams, lotions, or ointments such as neosporin on your incisions after surgery until advised by your surgeon because they can cause removal of the dermabond glue on your incisions.     8. You may experience vaginal spotting after surgery or around the 6-8 week mark from surgery when the stitches at the top of the vagina begin to dissolve.  The spotting is normal but if you experience heavy bleeding, call our office.   9. Take Tylenol or ibuprofen first for pain if you are able to take these medications and only use narcotic pain medication for severe pain not relieved by the  Tylenol or Ibuprofen.  Monitor your Tylenol intake to a max of 4,000 mg in a 24 hour period. You can alternate these medications after surgery.   Diet: 1. Low sodium Heart Healthy Diet is recommended but you are cleared to resume your normal (before surgery) diet after your procedure.   2. It is safe to use a laxative, such as Miralax or Colace, if you have difficulty moving your bowels. You will be prescribed Sennakot-S if you have a hysterectomy to take at bedtime every evening after surgery to keep bowel movements regular and to prevent constipation.     Wound Care: 1. Keep clean and dry.  Shower daily.   Reasons to call the Doctor: Fever - Oral temperature greater than 100.4 degrees Fahrenheit Foul-smelling vaginal discharge Difficulty urinating Nausea and vomiting Increased pain at the site of the incision that is unrelieved with pain medicine. Difficulty breathing with or without chest pain New calf pain especially if only on one side Sudden, continuing increased vaginal bleeding with or without clots.   Contacts: For questions or concerns you should contact:   Dr. Eugene Garnet at [phone]   Warner Mccreedy, NP at [phone]   After Hours: call [phone] and have the GYN Oncologist paged/contacted (after 5 pm or on the weekends). You will speak with an after hours  RN and let he or she know you have had surgery.   Messages sent via mychart are for non-urgent matters and are not responded to after hours so for urgent needs, please call the after hours number.

## 2023-03-04 NOTE — Op Note (Signed)
OPERATIVE NOTE  Pre-operative Diagnosis: AUB, thickened.abnormal endometrium on ultrasound, complex adnexal mass  Post-operative Diagnosis: same, benign endometrium on frozen (suspected adenomyosis)  Operation: Robotic-assisted laparoscopic total hysterectomy with bilateral salpingo-oophorectomy  Surgeon: Eugene Garnet MD  Assistant Surgeon: Antionette Char MD (an MD assistant was necessary for tissue manipulation, management of robotic instrumentation, retraction and positioning due to the complexity of the case and hospital policies).   Anesthesia: GET  Urine Output: 500 cc  Operative Findings: On EUA, mobile uterus. ON intra-abdominal entry, normal upper abdominal survey. Normal appearing small bowel, appendix, colon, and omentum. No ascites. Right ovary mildly enlarged, otherwise normal in appearance. Left ovary normal in appearance. Left fallopian tube adherent distally to medially leaf of the broad ligament. Uterus 8-10 cm, normal in appearance. ON frozen section, benign endometrium, possible adenomyosis. Nothing to freeze grossly on either ovary. Moderate adhesions between the bladder and cervix, likely from prior cervical excision procedure.  Estimated Blood Loss:  50 cc      Total IV Fluids: see I&O flowsheet         Specimens: uterus, cervix, bilateral tubes and ovaries, pelvic washings         Complications:  None apparent; patient tolerated the procedure well.         Disposition: PACU - hemodynamically stable.  Procedure Details  The patient was seen in the Holding Room. The risks, benefits, complications, treatment options, and expected outcomes were discussed with the patient.  The patient concurred with the proposed plan, giving informed consent.  The site of surgery properly noted/marked. The patient was identified as Nurse, children's and the procedure verified as a Robotic-assisted hysterectomy with bilateral salpingo oophorectomy.   After induction of anesthesia,  the patient was draped and prepped in the usual sterile manner. Patient was placed in supine position after anesthesia and draped and prepped in the usual sterile manner as follows: Her arms were tucked to her side with all appropriate precautions.  The patient was secured to the bed using padding and tape across her chest.  The patient was placed in the semi-lithotomy position in [place] stirrups.  The perineum and vagina were prepped with CHG. The patient's abdomen was prepped with ChloraPrep and then she was draped after the prep had been allowed to dry for 3 minutes.  A Time Out was held and the above information confirmed.  The urethra was prepped with Betadine. Foley catheter was placed.  A sterile speculum was placed in the vagina.  The cervix was grasped with a single-tooth tenaculum. The cervix was dilated with Shawnie Pons dilators.  The ZUMI uterine manipulator with a medium colpotomizer ring was placed without difficulty.  A pneum occluder balloon was placed over the manipulator.  OG tube placement was confirmed and to suction.   Next, a 5 mm skin incision was made 1 cm below the subcostal margin in the midclavicular line.  The 5 mm Optiview port and scope was used for direct entry.  Opening pressure was under 10 mm CO2.  The abdomen was insufflated and the findings were noted as above.   At this point and all points during the procedure, the patient's intra-abdominal pressure did not exceed 15 mmHg. Next, an 8 mm skin incision was made superior to the umbilicus and a right and left port were placed about 8 cm lateral to the robot port on the right and left side.   The 5 mm assist trocar was exchanged for a 5 mm airseal port. All ports were  placed under direct visualization.  The patient was placed in steep Trendelenburg.  The robot was docked in the normal manner.  The right and left peritoneum were opened parallel to the IP ligament to open the retroperitoneal spaces bilaterally. The round ligaments were  transected. The ureter was noted to be on the medial leaf of the broad ligament.  The peritoneum above the ureter was incised and stretched and the infundibulopelvic ligament was skeletonized, cauterized and cut.  ON the left, once the ureter had been identified, monopolar electrocautery was used to free adhesions of the distal fallopian tube to the broad ligament.  The posterior peritoneum was taken down to the level of the KOH ring.  The anterior peritoneum was also taken down.  The bladder flap was created to the level of the KOH ring. This was somewhat challenging given adhesions between the bladder and cervix. The uterine artery on the right side was skeletonized, cauterized and cut in the normal manner.  A similar procedure was performed on the left.  The colpotomy was made and the uterus, cervix, bilateral ovaries and tubes were amputated and delivered through the vagina.  Pedicles were inspected and excellent hemostasis was achieved.    The colpotomy at the vaginal cuff was closed with 0 Vicryl with a figure of eight at each apex and 0 V-Loc to close the midportion of the cuff in a running manner.  Irrigation was used and excellent hemostasis was achieved.  At this point in the procedure was completed.  Robotic instruments were removed under direct visulaization.  The robot was undocked. CO2 was removed from the abdomen. The subcuticular tissue was closed with 4-0 Vicryl and the skin was closed with 4-0 Monocryl in a subcuticular manner.  Dermabond was applied.    The vagina was swabbed with  minimal bleeding noted. Foley catheter was removed.  All sponge, lap and needle counts were correct x  3.   The patient was transferred to the recovery room in stable condition.  Eugene Garnet, MD

## 2023-03-04 NOTE — Anesthesia Postprocedure Evaluation (Signed)
Anesthesia Post Note  Patient: Autumn Russo  Procedure(s) Performed: XI ROBOTIC ASSISTED TOTAL HYSTERECTOMY WITH BILATERAL SALPINGO OOPHORECTOMY     Patient location during evaluation: PACU Anesthesia Type: General Level of consciousness: awake and alert Pain management: pain level controlled Vital Signs Assessment: post-procedure vital signs reviewed and stable Respiratory status: spontaneous breathing, nonlabored ventilation, respiratory function stable and patient connected to nasal cannula oxygen Cardiovascular status: blood pressure returned to baseline and stable Postop Assessment: no apparent nausea or vomiting Anesthetic complications: no  No notable events documented.  Last Vitals:  Vitals:   03/04/23 1430 03/04/23 1440  BP:  126/74  Pulse:  67  Resp:    Temp: 36.4 C   SpO2:  99%    Last Pain:  Vitals:   03/04/23 1440  TempSrc:   PainSc: Asleep                 Kennieth Rad

## 2023-03-04 NOTE — Interval H&P Note (Signed)
History and Physical Interval Note: After long discussion with the patient following her repeat ultrasound, the patient ultimately has decided to proceed with TRH/BSO. Will send uterus for frozen section to dictate need for additional procedures.  03/04/2023 9:38 AM  Autumn Russo  has presented today for surgery, with the diagnosis of ABNORMAL UTERINE BLEEDING,  THICKENED ENDOMETRIUM, OVARIAN CYST.  The various methods of treatment have been discussed with the patient and family. After consideration of risks, benefits and other options for treatment, the patient has consented to  Procedure(s): XI ROBOTIC ASSISTED TOTAL HYSTERECTOMY WITH BILATERAL SALPINGO OOPHORECTOMY (N/A) Possible STAGING (N/A) as a surgical intervention.  The patient's history has been reviewed, patient examined, no change in status, stable for surgery.  I have reviewed the patient's chart and labs.  Questions were answered to the patient's satisfaction.     Carver Fila

## 2023-03-04 NOTE — Transfer of Care (Signed)
Immediate Anesthesia Transfer of Care Note  Patient: Autumn Russo  Procedure(s) Performed: XI ROBOTIC ASSISTED TOTAL HYSTERECTOMY WITH BILATERAL SALPINGO OOPHORECTOMY  Patient Location: PACU  Anesthesia Type:General  Level of Consciousness: awake, alert , and patient cooperative  Airway & Oxygen Therapy: Patient Spontanous Breathing and Patient connected to face mask oxygen  Post-op Assessment: Report given to RN and Post -op Vital signs reviewed and stable  Post vital signs: Reviewed and stable  Last Vitals:  Vitals Value Taken Time  BP 134/89 03/04/23 1300  Temp    Pulse 71 03/04/23 1302  Resp 0 03/04/23 1302  SpO2 100 % 03/04/23 1302  Vitals shown include unvalidated device data.  Last Pain:  Vitals:   03/04/23 0905  TempSrc: Oral         Complications: No notable events documented.

## 2023-03-05 ENCOUNTER — Encounter (HOSPITAL_COMMUNITY): Payer: Self-pay | Admitting: Gynecologic Oncology

## 2023-03-05 LAB — TYPE AND SCREEN: Antibody Screen: NEGATIVE

## 2023-03-05 LAB — ABO/RH: ABO/RH(D): B POS

## 2023-03-06 ENCOUNTER — Telehealth: Payer: Self-pay | Admitting: *Deleted

## 2023-03-06 LAB — CYTOLOGY - NON PAP

## 2023-03-06 LAB — SURGICAL PATHOLOGY

## 2023-03-06 NOTE — Telephone Encounter (Signed)
Spoke with Ms. Moncure this morning. She states she is eating, drinking and urinating well. She is passing gas & had a BM yesterday - normal BM then one episode of diarrhea, no diarrhea today.  She is drinking plenty of water.  She denies fever or chills. Incisions are dry and intact, she states her husband inspects them daily.  She denies acute pain, has discomfort & some shoulder pain. Informed patient this is gas pain - she may take Gas X & also needs to be up moving around frequently, which she is. Informed her this should dissipate with time. Her pain is controlled with Ibuprofen, she has not taken any tramadol so far. She denies any vaginal bleeding. Patient had question about when to apply estradiol patch - per Dr Pricilla Holm, she may apply the patch anytime during this first week post op.    Instructed to call office with any fever, chills, purulent drainage, uncontrolled pain or any other questions or concerns. Patient verbalizes understanding.   Pt aware of post op appointments as well as the office number [phone] and after hours number [phone] to call if she has any questions or concerns

## 2023-03-11 ENCOUNTER — Inpatient Hospital Stay: Payer: Managed Care, Other (non HMO) | Attending: Gynecologic Oncology | Admitting: Gynecologic Oncology

## 2023-03-11 ENCOUNTER — Encounter: Payer: Self-pay | Admitting: Gynecologic Oncology

## 2023-03-11 DIAGNOSIS — N949 Unspecified condition associated with female genital organs and menstrual cycle: Secondary | ICD-10-CM

## 2023-03-11 DIAGNOSIS — N924 Excessive bleeding in the premenopausal period: Secondary | ICD-10-CM | POA: Insufficient documentation

## 2023-03-11 DIAGNOSIS — Z90722 Acquired absence of ovaries, bilateral: Secondary | ICD-10-CM

## 2023-03-11 DIAGNOSIS — Z8049 Family history of malignant neoplasm of other genital organs: Secondary | ICD-10-CM

## 2023-03-11 DIAGNOSIS — R9389 Abnormal findings on diagnostic imaging of other specified body structures: Secondary | ICD-10-CM | POA: Insufficient documentation

## 2023-03-11 DIAGNOSIS — Z90710 Acquired absence of both cervix and uterus: Secondary | ICD-10-CM

## 2023-03-11 NOTE — Progress Notes (Signed)
Gynecologic Oncology Telehealth Note: Gyn-Onc  I connected with Autumn Russo on 03/11/23 at  4:20 PM EDT by telephone and verified that I am speaking with the correct person using two identifiers.  I discussed the limitations, risks, security and privacy concerns of performing an evaluation and management service by telemedicine and the availability of in-person appointments. I also discussed with the patient that there may be a patient responsible charge related to this service. The patient expressed understanding and agreed to proceed.  Other persons participating in the visit and their role in the encounter: none.  Patient's location: home Provider's location: [facility]  Reason for Visit: follow-up  Treatment History: Patient was on oral contraceptive pills until August 2023.  She had been on these for approximately 24 years for birth control.  She would typically have a light monthly menses.  After stopping OCPs, she had 1 week of heavy bleeding in October.  She had a normal menses in December.  Most recently, in mid May, she had a week of heavy bleeding followed by a week of spotting followed by a week of normal bleeding followed by a week of very heavy bleeding.  This last week required changing super tampons and many pads multiple times during the day.  Yesterday, she had changed both 9 times before 3 PM and had bled through the tampons and pads twice.  She endorses passing small clots as well.  She denies any associated pain or cramping.   Yesterday, the patient was seen by her OB/GYN in the setting of her bleeding.  Pelvic ultrasound exam showed a uterus measuring 9.4 x 6.01 x 4.3 cm with an endometrial lining of 28.3 mm.  Endometrium is described as heterogenous and thickened with areas of cystic spaces, hypervascular.  There is a 6 x 5.1 x 5.1 cm right adnexal versus right ovarian septated lesion.  No free fluid seen.  Left ovary not visualized.  03/04/23: Robotic-assisted laparoscopic total  hysterectomy with bilateral salpingo-oophorectomy  Findings: On EUA, mobile uterus. ON intra-abdominal entry, normal upper abdominal survey. Normal appearing small bowel, appendix, colon, and omentum. No ascites. Right ovary mildly enlarged, otherwise normal in appearance. Left ovary normal in appearance. Left fallopian tube adherent distally to medially leaf of the broad ligament. Uterus 8-10 cm, normal in appearance. ON frozen section, benign endometrium, possible adenomyosis. Nothing to freeze grossly on either ovary. Moderate adhesions between the bladder and cervix, likely from prior cervical excision procedure.   Interval History: Doing well. Denies pain.  Voiding without difficulty, having regular bowel function. Denies any vaginal bleeding.  Past Medical/Surgical History: Past Medical History:  Diagnosis Date   RA (rheumatoid arthritis) (HCC)    treated for previously, but now symptoms currently   Thickened endometrium     Past Surgical History:  Procedure Laterality Date   BREAST BIOPSY Left    LEEP     ROBOTIC ASSISTED TOTAL HYSTERECTOMY WITH BILATERAL SALPINGO OOPHERECTOMY N/A 03/04/2023   Procedure: XI ROBOTIC ASSISTED TOTAL HYSTERECTOMY WITH BILATERAL SALPINGO OOPHORECTOMY;  Surgeon: Carver Fila, MD;  Location: [facility];  Service: Gynecology;  Laterality: N/A;    Family History  Problem Relation Age of Onset   Uterine cancer Mother        dx in early 69s, still living    Social History   Socioeconomic History   Marital status: Married    Spouse name: Not on file   Number of children: Not on file   Years of education: Not on file  Highest education level: Not on file  Occupational History   Not on file  Tobacco Use   Smoking status: Former    Years: 5    Types: Cigarettes    Quit date: 1997    Years since quitting: 27.5   Smokeless tobacco: Never  Vaping Use   Vaping Use: Never used  Substance and Sexual Activity   Alcohol use: No   Drug use: No    Sexual activity: Not on file  Other Topics Concern   Not on file  Social History Narrative   Not on file   Social Determinants of Health   Financial Resource Strain: Not on file  Food Insecurity: Not on file  Transportation Needs: Not on file  Physical Activity: Not on file  Stress: Not on file  Social Connections: Not on file    Current Medications:  Current Outpatient Medications:    aspirin-acetaminophen-caffeine (EXCEDRIN MIGRAINE) 250-250-65 MG tablet, Take 1 tablet by mouth as needed for headache., Disp: , Rfl:    estradiol (CLIMARA) 0.05 mg/24hr patch, Place 1 patch (0.05 mg total) onto the skin once a week., Disp: 12 patch, Rfl: 3   ibuprofen (ADVIL) 800 MG tablet, Take 1 tablet (800 mg total) by mouth every 8 (eight) hours as needed for moderate pain. For AFTER surgery only, Disp: 30 tablet, Rfl: 0   senna-docusate (SENOKOT-S) 8.6-50 MG tablet, Take 2 tablets by mouth at bedtime. For AFTER surgery, do not take if having diarrhea, Disp: 30 tablet, Rfl: 0   traMADol (ULTRAM) 50 MG tablet, Take 1 tablet (50 mg total) by mouth every 6 (six) hours as needed for severe pain. For AFTER surgery only, do not take and drive, Disp: 10 tablet, Rfl: 0  Review of Symptoms: Pertinent positives as per HPI.  Physical Exam: Deferred given limitations of phone visit.  Laboratory & Radiologic Studies: FINAL MICROSCOPIC DIAGNOSIS:  - Atypical cells present  - See comment   SPECIMEN ADEQUACY:  Satisfactory for evaluation   DIAGNOSTIC COMMENTS:  There are few clusters of mildly atypical cells and are not diagnostic  of malignancy in my opinion.   A. UTERUS, CERVIX, BILATERAL FALLOPIAN TUBES AND OVARIES:  - Uterus with benign inactive endometrium  - Adenomyosis  - Benign unremarkable cervix  - Benign unremarkable bilateral fallopian tubes  - Right ovary with benign serous cystadenoma; left ovary is unremarkable  - No evidence of malignancy   Assessment & Plan: Autumn Russo is a  52 y.o. woman with s/p TRH/BSO in the setting of AUB, family history of uterine cancer, and adnexal mass.  Patient doing very well from a post-op standpoint. Discussed pathology as well as cytology. I have asked for review of her cytology given normal findings intra-op and benign pathology. I will call the patient with these results.   I discussed the assessment and treatment plan with the patient. The patient was provided with an opportunity to ask questions and all were answered. The patient agreed with the plan and demonstrated an understanding of the instructions.   The patient was advised to call back or see an in-person evaluation if the symptoms worsen or if the condition fails to improve as anticipated.   8 minutes of total time was spent for this patient encounter, including preparation, phone counseling with the patient and coordination of care, and documentation of the encounter.   Eugene Garnet, MD  Division of Gynecologic Oncology  Department of Obstetrics and Gynecology  [facility]

## 2023-03-16 ENCOUNTER — Telehealth: Payer: Self-pay | Admitting: Surgery

## 2023-03-16 ENCOUNTER — Other Ambulatory Visit: Payer: Self-pay

## 2023-03-16 ENCOUNTER — Inpatient Hospital Stay: Payer: Managed Care, Other (non HMO)

## 2023-03-16 ENCOUNTER — Telehealth: Payer: Self-pay | Admitting: *Deleted

## 2023-03-16 DIAGNOSIS — N924 Excessive bleeding in the premenopausal period: Secondary | ICD-10-CM | POA: Diagnosis present

## 2023-03-16 DIAGNOSIS — R9389 Abnormal findings on diagnostic imaging of other specified body structures: Secondary | ICD-10-CM | POA: Diagnosis present

## 2023-03-16 DIAGNOSIS — N949 Unspecified condition associated with female genital organs and menstrual cycle: Secondary | ICD-10-CM

## 2023-03-16 DIAGNOSIS — B3731 Acute candidiasis of vulva and vagina: Secondary | ICD-10-CM

## 2023-03-16 LAB — URINALYSIS, COMPLETE (UACMP) WITH MICROSCOPIC
Bacteria, UA: NONE SEEN
Bilirubin Urine: NEGATIVE
Glucose, UA: NEGATIVE mg/dL
Hgb urine dipstick: NEGATIVE
Ketones, ur: NEGATIVE mg/dL
Leukocytes,Ua: NEGATIVE
Nitrite: NEGATIVE
Protein, ur: NEGATIVE mg/dL
Specific Gravity, Urine: 1.004 — ABNORMAL LOW (ref 1.005–1.030)
pH: 6 (ref 5.0–8.0)

## 2023-03-16 MED ORDER — MICONAZOLE NITRATE 2 % EX CREA
1.0000 | TOPICAL_CREAM | Freq: Two times a day (BID) | CUTANEOUS | 0 refills | Status: AC
Start: 2023-03-16 — End: ?

## 2023-03-16 MED ORDER — FLUCONAZOLE 150 MG PO TABS
150.0000 mg | ORAL_TABLET | Freq: Once | ORAL | 0 refills | Status: AC
Start: 2023-03-16 — End: 2023-03-16

## 2023-03-16 NOTE — Telephone Encounter (Signed)
-----   Message from Doylene Bode sent at 03/16/2023  2:53 PM EDT ----- Please let her know urine sample looks unremarkable, no obvious sign of infection ----- Message ----- From: Interface, Lab In [place] Sent: 03/16/2023  12:30 PM EDT To: Doylene Bode, NP

## 2023-03-16 NOTE — Telephone Encounter (Signed)
Called patient to let her know UA results were normal. Patient had no other concerns at this time.

## 2023-03-16 NOTE — Telephone Encounter (Signed)
Fax FMLA to [employer]

## 2023-03-16 NOTE — Telephone Encounter (Signed)
Called patient to let her know that diflucan and miconazole ointment has been called in to her preferred pharmacy. Patient verbalized understanding and had no other concerns.

## 2023-03-16 NOTE — Addendum Note (Signed)
Addended by: Warner Mccreedy D on: 03/16/2023 11:43 AM   Modules accepted: Orders

## 2023-03-16 NOTE — Telephone Encounter (Signed)
Patient called in stating she is having itching (describes are more external in vulvar area, not internal), and burning which occurs mostly when she pees. Denies urgency, pressure, pain, fevers, or discharge of any kind. States it feels more like a yeast infection than a UTI. Denies any discharge currently but states she had some slimy discharge about a week ago, none since.   Scheduled patient for lab appointment to give a urine sample and advised patient that once we have these results our office will call her back with results and next steps. Patient verbalized understanding and also stated that she will be bringing her FMLA paperwork in that needs to be sent to her emplyer by 7/17. Advised patient that it typically takes 7-10 business days to complete FMLA paperwork but that we would do our best to send to her employer asap. Patient verbalized understanding and had no further concerns.

## 2023-03-17 ENCOUNTER — Telehealth: Payer: Self-pay | Admitting: Oncology

## 2023-03-17 LAB — URINE CULTURE: Culture: NO GROWTH

## 2023-03-17 NOTE — Telephone Encounter (Signed)
Called [facility] and spoke to [place].  Requested review of accession [id number] per Dr. Pricilla Holm.

## 2023-03-23 NOTE — Telephone Encounter (Signed)
Called Autumn Russo regarding review of [id number].  She said Dr. Kenard Gower was going to call but is on vacation this week.  She will have another pathologist call if he did not leave any instructions.

## 2023-03-27 ENCOUNTER — Inpatient Hospital Stay (HOSPITAL_BASED_OUTPATIENT_CLINIC_OR_DEPARTMENT_OTHER): Payer: Managed Care, Other (non HMO) | Admitting: Gynecologic Oncology

## 2023-03-27 ENCOUNTER — Encounter: Payer: Self-pay | Admitting: Gynecologic Oncology

## 2023-03-27 VITALS — BP 132/84 | HR 91 | Temp 98.2°F | Resp 20 | Wt 164.6 lb

## 2023-03-27 DIAGNOSIS — N949 Unspecified condition associated with female genital organs and menstrual cycle: Secondary | ICD-10-CM

## 2023-03-27 DIAGNOSIS — Z90710 Acquired absence of both cervix and uterus: Secondary | ICD-10-CM

## 2023-03-27 DIAGNOSIS — R9389 Abnormal findings on diagnostic imaging of other specified body structures: Secondary | ICD-10-CM

## 2023-03-27 DIAGNOSIS — Z90722 Acquired absence of ovaries, bilateral: Secondary | ICD-10-CM

## 2023-03-27 DIAGNOSIS — E8940 Asymptomatic postprocedural ovarian failure: Secondary | ICD-10-CM

## 2023-03-27 NOTE — Patient Instructions (Signed)
It was good to see you today.  You are healing well from surgery!  Please remember no heavy lifting for 6 weeks after surgery and nothing in the vagina for at least 10 weeks.  I will send my note to your OB/GYN for further follow-up regarding hormone replacement therapy.

## 2023-03-27 NOTE — Progress Notes (Signed)
Gynecologic Oncology Return Clinic Visit  03/27/23  Reason for Visit: follow-up  Treatment History: Patient was on oral contraceptive pills until August 2023.  She had been on these for approximately 24 years for birth control.  She would typically have a light monthly menses.  After stopping OCPs, she had 1 week of heavy bleeding in October.  She had a normal menses in December.  Most recently, in mid May, she had a week of heavy bleeding followed by a week of spotting followed by a week of normal bleeding followed by a week of very heavy bleeding.  This last week required changing super tampons and many pads multiple times during the day.  Yesterday, she had changed both 9 times before 3 PM and had bled through the tampons and pads twice.  She endorses passing small clots as well.  She denies any associated pain or cramping.   Yesterday, the patient was seen by her OB/GYN in the setting of her bleeding.  Pelvic ultrasound exam showed a uterus measuring 9.4 x 6.01 x 4.3 cm with an endometrial lining of 28.3 mm.  Endometrium is described as heterogenous and thickened with areas of cystic spaces, hypervascular.  There is a 6 x 5.1 x 5.1 cm right adnexal versus right ovarian septated lesion.  No free fluid seen.  Left ovary not visualized.   Patient was given a Megace taper.  She is now taken 3 doses and had minimal bleeding overnight and nothing on her tampon when she removed it earlier this morning.   Family history is notable for uterine cancer diagnosed in her mother approximately 25 years ago.  Mother is still alive.  03/04/23: Robotic-assisted laparoscopic total hysterectomy with bilateral salpingo-oophorectomy   Interval History: Doing well.  Denies any abdominal or pelvic pain.  Reports baseline bowel bladder function.  Denies any vaginal bleeding or discharge.  Has been using hormone patch without issues, denies any menopausal symptoms including hot flashes.  Past Medical/Surgical  History: Past Medical History:  Diagnosis Date   RA (rheumatoid arthritis) (HCC)    treated for previously, but now symptoms currently   Thickened endometrium     Past Surgical History:  Procedure Laterality Date   BREAST BIOPSY Left    LEEP     ROBOTIC ASSISTED TOTAL HYSTERECTOMY WITH BILATERAL SALPINGO OOPHERECTOMY N/A 03/04/2023   Procedure: XI ROBOTIC ASSISTED TOTAL HYSTERECTOMY WITH BILATERAL SALPINGO OOPHORECTOMY;  Surgeon: Carver Fila, MD;  Location: [facility];  Service: Gynecology;  Laterality: N/A;    Family History  Problem Relation Age of Onset   Uterine cancer Mother        dx in early 21s, still living    Social History   Socioeconomic History   Marital status: Married    Spouse name: Not on file   Number of children: Not on file   Years of education: Not on file   Highest education level: Not on file  Occupational History   Not on file  Tobacco Use   Smoking status: Former    Current packs/day: 0.00    Types: Cigarettes    Start date: 68    Quit date: 1997    Years since quitting: 27.5   Smokeless tobacco: Never  Vaping Use   Vaping status: Never Used  Substance and Sexual Activity   Alcohol use: No   Drug use: No   Sexual activity: Not on file  Other Topics Concern   Not on file  Social History Narrative  Not on file   Social Determinants of Health   Financial Resource Strain: Not on file  Food Insecurity: Low Risk  (02/23/2023)   [facility]   Food vital sign    Within the past 12 months, you worried that your food would run out before you got money to buy more: Never true    Within the past 12 months, the food you bought just didn't last and you didn't have money to get more. : Never true  Transportation Needs: No Transportation Needs (02/23/2023)   [facility]   Transportation    In the past 12 months, has lack of reliable transportation kept you from medical appointments,  meetings, work or from getting things needed for daily living? : No  Physical Activity: Not on file  Stress: Not on file  Social Connections: Not on file    Current Medications:  Current Outpatient Medications:    aspirin-acetaminophen-caffeine (EXCEDRIN MIGRAINE) 250-250-65 MG tablet, Take 1 tablet by mouth as needed for headache., Disp: , Rfl:    estradiol (CLIMARA) 0.05 mg/24hr patch, Place 1 patch (0.05 mg total) onto the skin once a week., Disp: 12 patch, Rfl: 3   ibuprofen (ADVIL) 800 MG tablet, Take 1 tablet (800 mg total) by mouth every 8 (eight) hours as needed for moderate pain. For AFTER surgery only, Disp: 30 tablet, Rfl: 0   miconazole (MICATIN) 2 % cream, Apply 1 Application topically 2 (two) times daily. To the vulva for yeast, Disp: 28.35 g, Rfl: 0   senna-docusate (SENOKOT-S) 8.6-50 MG tablet, Take 2 tablets by mouth at bedtime. For AFTER surgery, do not take if having diarrhea, Disp: 30 tablet, Rfl: 0   traMADol (ULTRAM) 50 MG tablet, Take 1 tablet (50 mg total) by mouth every 6 (six) hours as needed for severe pain. For AFTER surgery only, do not take and drive, Disp: 10 tablet, Rfl: 0  Review of Systems: Denies appetite changes, fevers, chills, fatigue, unexplained weight changes. Denies hearing loss, neck lumps or masses, mouth sores, ringing in ears or voice changes. Denies cough or wheezing.  Denies shortness of breath. Denies chest pain or palpitations. Denies leg swelling. Denies abdominal distention, pain, blood in stools, constipation, diarrhea, nausea, vomiting, or early satiety. Denies pain with intercourse, dysuria, frequency, hematuria or incontinence. Denies hot flashes, pelvic pain, vaginal bleeding or vaginal discharge.   Denies joint pain, back pain or muscle pain/cramps. Denies itching, rash, or wounds. Denies dizziness, headaches, numbness or seizures. Denies swollen lymph nodes or glands, denies easy bruising or bleeding. Denies anxiety, depression,  confusion, or decreased concentration.  Physical Exam: BP 132/84   Pulse 91   Temp 98.2 F (36.8 C)   Resp 20   Wt 164 lb 9.6 oz (74.7 kg)   SpO2 100%   BMI 27.82 kg/m  General: Alert, oriented, no acute distress. HEENT: Atraumatic, normocephalic, sclera anicteric. Chest: Unlabored breathing on room air. Abdomen: soft, nontender.  Normoactive bowel sounds.  No masses or hepatosplenomegaly appreciated.  Well-healed incisions. Extremities: Grossly normal range of motion.  Warm, well perfused.  No edema bilaterally. GU: Normal appearing external genitalia without erythema, excoriation, or lesions.  Speculum exam reveals cuff intact, suture visible.  Bimanual exam reveals cuff intact, no fluctuance or tenderness to palpation.    Laboratory & Radiologic Studies: A. 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  Assessment & Plan: Autumn Russo is a 52 y.o. woman s/p TRH/BSO for AUB, thickened endometrium on ultrasound, complex adnexal mass, family history of uterine cancer.  Final pathology benign.  Patient is doing very well after surgery, meeting all postoperative milestones.  Discussed continued expectations and restrictions.  Started on hormone replacement therapy after surgery given premenopausal status at the time of surgery.  She is not having any symptoms including hot flashes.  I have encouraged her to follow-up with her OB/GYN for further HRT management and ultimately decision about when to decrease or stop her HRT.  20 minutes of total time was spent for this patient  encounter, including preparation, face-to-face counseling with the patient and coordination of care, and documentation of the encounter.  Eugene Garnet, MD  Division of Gynecologic Oncology  Department of Obstetrics and Gynecology  [facility]

## 2023-04-01 ENCOUNTER — Telehealth: Payer: Self-pay

## 2023-04-01 NOTE — Telephone Encounter (Signed)
Fitness for Duty forms received and emailed today
